# Patient Record
Sex: Male | Born: 1990
Health system: Southern US, Community
[De-identification: ages and names within clinical notes are randomized; demographics above are authoritative.]

## PROBLEM LIST (undated history)

## (undated) DIAGNOSIS — F329 Major depressive disorder, single episode, unspecified: Secondary | ICD-10-CM

## (undated) DIAGNOSIS — F419 Anxiety disorder, unspecified: Secondary | ICD-10-CM

## (undated) DIAGNOSIS — G43909 Migraine, unspecified, not intractable, without status migrainosus: Secondary | ICD-10-CM

## (undated) DIAGNOSIS — J039 Acute tonsillitis, unspecified: Secondary | ICD-10-CM

## (undated) DIAGNOSIS — F32A Depression, unspecified: Secondary | ICD-10-CM

## (undated) HISTORY — DX: Major depressive disorder, single episode, unspecified: F32.9

## (undated) HISTORY — DX: Depression, unspecified: F32.A

## (undated) HISTORY — DX: Migraine, unspecified, not intractable, without status migrainosus: G43.909

## (undated) HISTORY — PX: NO PAST SURGERIES: SHX2092

## (undated) HISTORY — DX: Anxiety disorder, unspecified: F41.9

## (undated) HISTORY — DX: Acute tonsillitis, unspecified: J03.90

---
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
## 2012-07-12 ENCOUNTER — Emergency Department (HOSPITAL_COMMUNITY): Payer: 59

## 2012-07-12 ENCOUNTER — Encounter (HOSPITAL_COMMUNITY): Payer: Self-pay | Admitting: *Deleted

## 2012-07-12 ENCOUNTER — Emergency Department (HOSPITAL_COMMUNITY)
Admission: EM | Admit: 2012-07-12 | Discharge: 2012-07-12 | Disposition: A | Discharge status: Home / Self Care | Payer: 59 | Attending: Emergency Medicine | Admitting: Emergency Medicine

## 2012-07-12 DIAGNOSIS — J36 Peritonsillar abscess: Secondary | ICD-10-CM

## 2012-07-12 LAB — CBC WITH DIFFERENTIAL/PLATELET
Basophils Absolute: 0.1 10*3/uL (ref 0.0–0.1)
Eosinophils Relative: 0 % (ref 0–5)
Lymphs Abs: 1.2 10*3/uL (ref 0.7–4.0)
MCV: 90.9 fL (ref 78.0–100.0)
Monocytes Absolute: 1.1 10*3/uL — ABNORMAL HIGH (ref 0.1–1.0)
Monocytes Relative: 23 % — ABNORMAL HIGH (ref 3–12)
Neutrophils Relative %: 47 % (ref 43–77)
Platelets: 182 10*3/uL (ref 150–400)
RBC: 4.85 MIL/uL (ref 4.22–5.81)
RDW: 12.2 % (ref 11.5–15.5)
WBC: 4.6 10*3/uL (ref 4.0–10.5)

## 2012-07-12 LAB — BASIC METABOLIC PANEL
CO2: 28 mEq/L (ref 19–32)
Calcium: 9.4 mg/dL (ref 8.4–10.5)
Creatinine, Ser: 0.83 mg/dL (ref 0.50–1.35)
GFR calc Af Amer: >90 mL/min (ref >90)
Sodium: 136 mEq/L (ref 135–145)

## 2012-07-12 MED ORDER — SODIUM CHLORIDE 0.9 % IV SOLN
1000.0000 mL | INTRAVENOUS | Status: DC
  Administered 2012-07-12: 1000 mL via INTRAVENOUS

## 2012-07-12 MED ORDER — IOHEXOL 300 MG/ML  SOLN
100.0000 mL | Freq: Once | INTRAMUSCULAR | Status: AC | PRN
  Administered 2012-07-12: 100 mL via INTRAVENOUS

## 2012-07-12 MED ORDER — ONDANSETRON HCL 4 MG/2ML IJ SOLN
4.0000 mg | Freq: Once | INTRAMUSCULAR | Status: AC
  Administered 2012-07-12: 4 mg via INTRAVENOUS
  Filled 2012-07-12: qty 2

## 2012-07-12 MED ORDER — MORPHINE SULFATE 4 MG/ML IJ SOLN
6.0000 mg | Freq: Once | INTRAMUSCULAR | Status: AC
  Administered 2012-07-12: 6 mg via INTRAVENOUS
  Filled 2012-07-12: qty 2

## 2012-07-12 MED ORDER — SODIUM CHLORIDE 0.9 % IV SOLN
1000.0000 mL | Freq: Once | INTRAVENOUS | Status: AC
  Administered 2012-07-12: 1000 mL via INTRAVENOUS

## 2012-07-12 MED ORDER — SODIUM CHLORIDE 0.9 % IV SOLN
3.0000 g | Freq: Once | INTRAVENOUS | Status: AC
  Administered 2012-07-12: 3 g via INTRAVENOUS
  Filled 2012-07-12: qty 3

## 2012-07-12 MED ORDER — DEXAMETHASONE SODIUM PHOSPHATE 10 MG/ML IJ SOLN
10.0000 mg | Freq: Once | INTRAMUSCULAR | Status: AC
  Administered 2012-07-12: 10 mg via INTRAVENOUS
  Filled 2012-07-12: qty 1

## 2012-07-12 NOTE — ED Provider Notes (Signed)
Medical screening examination/treatment/procedure(s) were conducted as a shared visit with non-physician practitioner(s) and myself.  I personally evaluated the patient during the encounter  Please see my other note. ENT follow up in office now. IV unasyn in ER along with symptomatic treatment  Lyanne Co, MD 07/12/12 1314

## 2012-07-12 NOTE — ED Notes (Signed)
Pt reports sore throat which started almost 2 weeks ago, went to [facility] clinic last Thursday, had a strep and mono test which was negative.  Was not getting any better, went to [facility] walk-in clinic last week and was given a "strong dose of abx and steroid injections."  Per family, they were given instructions to come to the ED if pt did not feel any better.  Pt has also taken day 4 day of PCN, stopped that and started augmentin today.  Throat appears red, pt reports pain when swallowing, denies any difficulty breathing at this time.

## 2012-07-12 NOTE — ED Provider Notes (Signed)
History     CSN: [id number]  Arrival date & time 07/12/12  1006   First MD Initiated Contact with Patient 07/12/12 1011      Chief Complaint  Patient presents with  . Sore Throat    (Consider location/radiation/quality/duration/timing/severity/associated sxs/prior treatment) HPI Comments: This is a 22 year old male, no pertinent past medical history, who presents emergency department with chief complaint of sore throat. Patient was seen approximately 2 weeks ago at [facility] and given a rapid strep and mono test which were both negative. He was given penicillin time. Patient was then reevaluated by [facility] positions, and was given an injection of antibiotic and steroid and treated with Augmentin. Patient was instructed to come to the emergency department if he was not any better. Patient states that he does not filling better today, so he came in for further evaluation. Still complaining of sore throat and low-grade fever. Denies any nausea, vomiting, or difficulty breathing. Patient states that his pain is worsened with swallowing. Pain is moderate in severity.  The history is provided by the patient. No language interpreter was used.    History reviewed. No pertinent past medical history.  History reviewed. No pertinent past surgical history.  No family history on file.  History  Substance Use Topics  . Smoking status: Never Smoker   . Smokeless tobacco: Never Used  . Alcohol Use: No      Review of Systems  All other systems reviewed and are negative.    Allergies  Review of patient's allergies indicates no known allergies.  Home Medications   Current Outpatient Rx  Name  Route  Sig  Dispense  Refill  . imipramine (TOFRANIL) 50 MG tablet   Oral   Take 150 mg by mouth 1 day or 1 dose.         . SUMAtriptan (IMITREX) 100 MG tablet   Oral   Take 1 tablet (100 mg total) by mouth 2 (two) times daily as needed.   10 tablet   6     BP 109/67  Pulse 106  Temp(Src)  99 F (37.2 C) (Oral)  Resp 20  SpO2 100%  Physical Exam  Nursing note and vitals reviewed. Constitutional: He is oriented to person, place, and time. He appears well-developed and well-nourished.  HENT:  Head: Normocephalic and atraumatic.  Nose: Nose normal.  Oropharynx is red and inflamed, right tonsil is swollen, with surrounding tissue also swollen, concern for peritonsillar abscess, exudates are present, uvula deviating laterally, airway is intact  Eyes: Conjunctivae and EOM are normal. Pupils are equal, round, and reactive to light. Right eye exhibits no discharge. Left eye exhibits no discharge. No scleral icterus.  Neck: Normal range of motion. Neck supple. No JVD present.  Cardiovascular: Normal rate, regular rhythm and normal heart sounds.  Exam reveals no gallop and no friction rub.   No murmur heard. Pulmonary/Chest: Effort normal and breath sounds normal. No respiratory distress. He has no wheezes. He has no rales. He exhibits no tenderness.  Abdominal: Soft. Bowel sounds are normal. He exhibits no distension and no mass. There is no tenderness. There is no rebound and no guarding.  Musculoskeletal: Normal range of motion. He exhibits no edema and no tenderness.  Neurological: He is alert and oriented to person, place, and time. He has normal reflexes.  CN 3-12 intact  Skin: Skin is warm and dry.  Psychiatric: He has a normal mood and affect. His behavior is normal. Judgment and thought content normal.  ED Course  Procedures (including critical care time)  Labs Reviewed  CBC WITH DIFFERENTIAL  BASIC METABOLIC PANEL   Results for orders placed during the hospital encounter of 07/12/12  CBC WITH DIFFERENTIAL      Result Value Range   WBC 4.6  4.0 - 10.5 K/uL   RBC 4.85  4.22 - 5.81 MIL/uL   Hemoglobin 15.3  13.0 - 17.0 g/dL   HCT 16.1  09.6 - 04.5 %   MCV 90.9  78.0 - 100.0 fL   MCH 31.5  26.0 - 34.0 pg   MCHC 34.7  30.0 - 36.0 g/dL   RDW 40.9  81.1 - 91.4 %    Platelets 182  150 - 400 K/uL   Neutrophils Relative 47  43 - 77 %   Lymphocytes Relative 27  12 - 46 %   Monocytes Relative 23 (*) 3 - 12 %   Eosinophils Relative 0  0 - 5 %   Basophils Relative 3 (*) 0 - 1 %   Neutro Abs 2.2  1.7 - 7.7 K/uL   Lymphs Abs 1.2  0.7 - 4.0 K/uL   Monocytes Absolute 1.1 (*) 0.1 - 1.0 K/uL   Eosinophils Absolute 0.0  0.0 - 0.7 K/uL   Basophils Absolute 0.1  0.0 - 0.1 K/uL   WBC Morphology VACUOLATED NEUTROPHILS    BASIC METABOLIC PANEL      Result Value Range   Sodium 136  135 - 145 mEq/L   Potassium 4.6  3.5 - 5.1 mEq/L   Chloride 97  96 - 112 mEq/L   CO2 28  19 - 32 mEq/L   Glucose, Bld 95  70 - 99 mg/dL   BUN 11  6 - 23 mg/dL   Creatinine, Ser 7.82  0.50 - 1.35 mg/dL   Calcium 9.4  8.4 - 95.6 mg/dL   GFR calc non Af Amer >90  >90 mL/min   GFR calc Af Amer >90  >90 mL/min   Ct Soft Tissue Neck W Contrast  07/12/2012  *RADIOLOGY REPORT*  Clinical Data: Sore throat.  Dysphagia.  Tonsillitis.  CT NECK WITH CONTRAST  Technique:  Multidetector CT imaging of the neck was performed with intravenous contrast.  Contrast: OMNIPAQUE IOHEXOL 300 MG/ML  SOLN  Comparison: None.  Findings: A 3.1 x 2.6 cm complex fluid collection identified in the right palatine tonsils, consistent with tonsillar abscess.  Left tonsillar tissues are enlarged and hyperenhancing with probable small 0.9 x 1.1 cm posterior fluid collection, also suggesting a tiny left-sided abscess.  Cervical lymphadenopathy is seen bilaterally, likely reactive.  Polypoid mucosal thickening is noted in the left maxillary sinus.  IMPRESSION: 3 cm complex fluid collection in the right palatine tonsil, consistent with tonsillar abscess.  There is probably a smaller approximately 1 cm abscess in the left tonsil.  Bulky cervical lymphadenopathy bilaterally, likely reactive.  I personally called the results of the study to the PA in the emergency department, Roxy Horseman, at 12:41 p.m. on 07/12/2012.    Original Report Authenticated By: Kennith Center, M.D.     I personally reviewed the imaging tests through PACS system  I reviewed available ER/hospitalization records through the EMR      1. Tonsillar abscess       MDM  22 year old male with sore throat, suspicious for peritonsillar abscess, will order labs, and moved to the acute side of the ED.  Care to be followed up by Dr. Patria Mane, who I have notified.  Roxy Horseman, PA-C 07/12/12 1312

## 2012-07-12 NOTE — ED Provider Notes (Signed)
1:00 PM Airway patent.  Nontoxic.  Tolerating secretions.  I discussed his case with the on-call ear nose and throat surgeon Dr. point Jenne Pane who agrees to see the patient in his office now.  The patient is stable for discharge from the emergency department with his parents taking him immediately to the ENT office.  No concern for airway compromise.  The encounter diagnosis was Tonsillar abscess.  Ct Soft Tissue Neck W Contrast  07/12/2012  *RADIOLOGY REPORT*  Clinical Data: Sore throat.  Dysphagia.  Tonsillitis.  CT NECK WITH CONTRAST  Technique:  Multidetector CT imaging of the neck was performed with intravenous contrast.  Contrast: OMNIPAQUE IOHEXOL 300 MG/ML  SOLN  Comparison: None.  Findings: A 3.1 x 2.6 cm complex fluid collection identified in the right palatine tonsils, consistent with tonsillar abscess.  Left tonsillar tissues are enlarged and hyperenhancing with probable small 0.9 x 1.1 cm posterior fluid collection, also suggesting a tiny left-sided abscess.  Cervical lymphadenopathy is seen bilaterally, likely reactive.  Polypoid mucosal thickening is noted in the left maxillary sinus.  IMPRESSION: 3 cm complex fluid collection in the right palatine tonsil, consistent with tonsillar abscess.  There is probably a smaller approximately 1 cm abscess in the left tonsil.  Bulky cervical lymphadenopathy bilaterally, likely reactive.  I personally called the results of the study to the PA in the emergency department, Roxy Horseman, at 12:41 p.m. on 07/12/2012.   Original Report Authenticated By: Kennith Center, M.D.    I personally reviewed the imaging tests through PACS system I reviewed available ER/hospitalization records through the EMR   Lyanne Co, MD 07/12/12 1300

## 2013-08-30 ENCOUNTER — Encounter (INDEPENDENT_AMBULATORY_CARE_PROVIDER_SITE_OTHER): Payer: Self-pay

## 2013-08-30 ENCOUNTER — Ambulatory Visit (INDEPENDENT_AMBULATORY_CARE_PROVIDER_SITE_OTHER): Payer: Managed Care, Other (non HMO) | Admitting: Neurology

## 2013-08-30 ENCOUNTER — Encounter: Payer: Self-pay | Admitting: Neurology

## 2013-08-30 VITALS — BP 97/58 | HR 65 | Ht 67.6 in | Wt 135.0 lb

## 2013-08-30 DIAGNOSIS — G43009 Migraine without aura, not intractable, without status migrainosus: Secondary | ICD-10-CM

## 2013-08-30 DIAGNOSIS — G8929 Other chronic pain: Secondary | ICD-10-CM

## 2013-08-30 DIAGNOSIS — R51 Headache: Secondary | ICD-10-CM

## 2013-08-30 DIAGNOSIS — R519 Headache, unspecified: Secondary | ICD-10-CM

## 2013-08-30 MED ORDER — RIZATRIPTAN BENZOATE 10 MG PO TABS: 10.0000 mg | ORAL_TABLET | Freq: Three times a day (TID) | ORAL | Status: DC | PRN

## 2013-08-30 NOTE — Patient Instructions (Signed)
Migraine Headache A migraine headache is an intense, throbbing pain on one or both sides of your head. A migraine can last for 30 minutes to several hours. CAUSES  The exact cause of a migraine headache is not always known. However, a migraine may be caused when nerves in the brain become irritated and release chemicals that cause inflammation. This causes pain. Certain things may also trigger migraines, such as:  Alcohol.  Smoking.  Stress.  Menstruation.  Aged cheeses.  Foods or drinks that contain nitrates, glutamate, aspartame, or tyramine.  Lack of sleep.  Chocolate.  Caffeine.  Hunger.  Physical exertion.  Fatigue.  Medicines used to treat chest pain (nitroglycerine), birth control pills, estrogen, and some blood pressure medicines. SIGNS AND SYMPTOMS  Pain on one or both sides of your head.  Pulsating or throbbing pain.  Severe pain that prevents daily activities.  Pain that is aggravated by any physical activity.  Nausea, vomiting, or both.  Dizziness.  Pain with exposure to bright lights, loud noises, or activity.  General sensitivity to bright lights, loud noises, or smells. Before you get a migraine, you may get warning signs that a migraine is coming (aura). An aura may include:  Seeing flashing lights.  Seeing bright spots, halos, or zig-zag lines.  Having tunnel vision or blurred vision.  Having feelings of numbness or tingling.  Having trouble talking.  Having muscle weakness. DIAGNOSIS  A migraine headache is often diagnosed based on:  Symptoms.  Physical exam.  A CT scan or MRI of your head. These imaging tests cannot diagnose migraines, but they can help rule out other causes of headaches. TREATMENT Medicines may be given for pain and nausea. Medicines can also be given to help prevent recurrent migraines.  HOME CARE INSTRUCTIONS  Only take over-the-counter or prescription medicines for pain or discomfort as directed by your  health care provider. The use of long-term narcotics is not recommended.  Lie down in a dark, quiet room when you have a migraine.  Keep a journal to find out what may trigger your migraine headaches. For example, write down:  What you eat and drink.  How much sleep you get.  Any change to your diet or medicines.  Limit alcohol consumption.  Quit smoking if you smoke.  Get 7 9 hours of sleep, or as recommended by your health care provider.  Limit stress.  Keep lights dim if bright lights bother you and make your migraines worse. SEEK IMMEDIATE MEDICAL CARE IF:   Your migraine becomes severe.  You have a fever.  You have a stiff neck.  You have vision loss.  You have muscular weakness or loss of muscle control.  You start losing your balance or have trouble walking.  You feel faint or pass out.  You have severe symptoms that are different from your first symptoms. MAKE SURE YOU:   Understand these instructions.  Will watch your condition.  Will get help right away if you are not doing well or get worse. Document Released: 04/07/2005 Document Revised: 01/26/2013 Document Reviewed: 12/13/2012 Exit[employer] Patient Information 2014 [employer].  

## 2013-08-30 NOTE — Progress Notes (Signed)
Reason for visit: Headache  Ethan Nelson is a 23 y.o. male  History of present illness:  Ethan Nelson is a 23 year old right-handed white male with a history of headaches dating back about 3 years. He indicates that the headaches will generally occur once a week, oftentimes on Monday afternoons at 4 PM. He indicates that when he wakes up that morning, he can oftentimes tell that he is going to have a headache. He indicates that the headaches are generally frontal in nature, associated with a pressure and a throbbing sensation. There is no nausea or vomiting, but he may have some decrease in appetite. He denies any visual complaints, or numbness or weakness with the headache. He does have some photophobia and phonophobia with the headache. Sleep will help the headache. He will take Excedrin Migraine with some benefit. In the past, Imitrex tablets were used, but this resulted in a significant panic attack. The patient denies any other particular activators for his headache. He is sent to this office for further evaluation.  Past Medical History  Diagnosis Date  . Migraine   . Depression   . Anxiety     Panic disorder  . Tonsillitis     History reviewed. No pertinent past surgical history.  Family History  Problem Relation Age of Onset  . Breast cancer Mother   . Hypertension Father   . Migraines Father   . Dementia Maternal Grandmother   . Stroke Paternal Grandmother     Social history:  reports that he has never smoked. He has never used smokeless tobacco. He reports that he uses illicit drugs (Marijuana). He reports that he does not drink alcohol.  Medications:  Current Outpatient Prescriptions on File Prior to Visit  Medication Sig Dispense Refill  . ALPRAZolam (XANAX) 0.25 MG tablet Take 0.25 mg by mouth at bedtime as needed for sleep.       No current facility-administered medications on file prior to visit.     No Known Allergies  ROS:  Out of a complete 14  system review of symptoms, the patient complains only of the following symptoms, and all other reviewed systems are negative.  Fatigue Feeling hot Headache Depression, anxiety, change in appetite, disinterest in activities  Blood pressure 97/58, pulse 65, height 5' 7.6" (1.717 m), weight 135 lb (61.236 kg).  Physical Exam  General: The patient is alert and cooperative at the time of the examination.  Eyes: Pupils are equal, round, and reactive to light. Discs are flat bilaterally.  Neck: The neck is supple, no carotid bruits are noted.  Respiratory: The respiratory examination is clear.  Cardiovascular: The cardiovascular examination reveals a regular rate and rhythm, no obvious murmurs or rubs are noted.  Neuromuscular: Range of movement of the cervical spine is full. No crepitus is noted in the temporomandibular joints.  Skin: Extremities are without significant edema.  Neurologic Exam  Mental status: The patient is alert and oriented x 3 at the time of the examination. The patient has apparent normal recent and remote memory, with an apparently normal attention span and concentration ability.  Cranial nerves: Facial symmetry is present. There is good sensation of the face to pinprick and soft touch bilaterally. The strength of the facial muscles and the muscles to head turning and shoulder shrug are normal bilaterally. Speech is well enunciated, no aphasia or dysarthria is noted. Extraocular movements are full. Visual fields are full. The tongue is midline, and the patient has symmetric elevation of the soft  palate. No obvious hearing deficits are noted.  Motor: The motor testing reveals 5 over 5 strength of all 4 extremities. Good symmetric motor tone is noted throughout.  Sensory: Sensory testing is intact to pinprick, soft touch, vibration sensation, and position sense on all 4 extremities. No evidence of extinction is noted.  Coordination: Cerebellar testing reveals good  finger-nose-finger and heel-to-shin bilaterally.  Gait and station: Gait is normal. Tandem gait is normal. Romberg is negative. No drift is seen.  Reflexes: Deep tendon reflexes are symmetric and normal bilaterally. Toes are downgoing bilaterally.   Assessment/Plan:  1. Migraine headache  2. Panic disorder  The patient is having headaches on a weekly basis. He will be given a trial on Maxalt tablets, and he can take this with his Excedrin Migraine. If the headaches continue or become more frequent, daily prophylactic medications can be used. The use of Frova may be considered in the future, as he indicates that he can predict when he is going to have a headache several hours before it begins. He will followup otherwise in 4 months.  Marlan Palau. Keith Deveron Shamoon MD 08/30/2013 5:41 PM  [facility] [address] [place], [place] [id number]  Phone [phone] Fax [phone]

## 2014-01-23 ENCOUNTER — Telehealth: Payer: Self-pay | Admitting: *Deleted

## 2014-01-23 NOTE — Telephone Encounter (Signed)
Patient's mother calling to schedule patient's appointment, scheduled patient for 01/26/14 at 3:30 pm with NP MM.

## 2014-01-26 ENCOUNTER — Ambulatory Visit (INDEPENDENT_AMBULATORY_CARE_PROVIDER_SITE_OTHER): Payer: Managed Care, Other (non HMO) | Admitting: Adult Health

## 2014-01-26 ENCOUNTER — Encounter (INDEPENDENT_AMBULATORY_CARE_PROVIDER_SITE_OTHER): Payer: Self-pay

## 2014-01-26 ENCOUNTER — Encounter: Payer: Self-pay | Admitting: Adult Health

## 2014-01-26 VITALS — BP 102/61 | HR 66 | Ht 67.6 in | Wt 132.6 lb

## 2014-01-26 DIAGNOSIS — G43009 Migraine without aura, not intractable, without status migrainosus: Secondary | ICD-10-CM

## 2014-01-26 MED ORDER — RIZATRIPTAN BENZOATE 10 MG PO TABS
10.0000 mg | ORAL_TABLET | Freq: Three times a day (TID) | ORAL | Status: DC | PRN
Start: 2014-01-26 — End: 2017-07-30

## 2014-01-26 NOTE — Patient Instructions (Signed)

## 2014-01-26 NOTE — Progress Notes (Signed)
I have read the note, and I agree with the clinical assessment and plan.  Ethan Nelson,Ethan Nelson   

## 2014-01-26 NOTE — Progress Notes (Signed)
PATIENT: Ethan Nelson DOB: March 06, 1991  REASON FOR VISIT: follow up HISTORY FROM: patient  HISTORY OF PRESENT ILLNESS: Ethan Nelson is a 23 year old male with a history of headaches. He returns today for follow-up. He is currently taking Maxalt and it works well for him. He states that during the summer he did not have many headaches. During the school year he has at least one headache a week.  Denies nausea or vomiting with the headaches but he does have a lack of appetite. + photophobia when he has the migraine. Denies any changes with his vision and denies any numbness or tingling. When he does not use the Maxalt he will use the Excedrin migraine.   HISTORY 08/30/13: 23 year old right-handed white male with a history of headaches dating back about 3 years. He indicates that the headaches will generally occur once a week, oftentimes on Monday afternoons at 4 PM. He indicates that when he wakes up that morning, he can oftentimes tell that he is going to have a headache. He indicates that the headaches are generally frontal in nature, associated with a pressure and a throbbing sensation. There is no nausea or vomiting, but he may have some decrease in appetite. He denies any visual complaints, or numbness or weakness with the headache. He does have some photophobia and phonophobia with the headache. Sleep will help the headache. He will take Excedrin Migraine with some benefit. In the past, Imitrex tablets were used, but this resulted in a significant panic attack. The patient denies any other particular activators for his headache. He is sent to this office for further evaluation.  REVIEW OF SYSTEMS: Full 14 system review of systems performed and notable only for:  Constitutional: Fatigue Eyes: N/A Ear/Nose/Throat: N/A  Skin: N/A  Cardiovascular: N/A  Respiratory: N/A  Gastrointestinal: N/A  Genitourinary: N/A Hematology/Lymphatic: N/A  Endocrine: N/A Musculoskeletal: Neck pain, neck  stiffness  Allergy/Immunology: N/A  Neurological: Headache Psychiatric: Agitation  Sleep: N/A   ALLERGIES: No Known Allergies  HOME MEDICATIONS: Outpatient Prescriptions Prior to Visit  Medication Sig Dispense Refill  . ALPRAZolam (XANAX) 0.25 MG tablet Take 0.25 mg by mouth at bedtime as needed for sleep.      . rizatriptan (MAXALT) 10 MG tablet Take 1 tablet (10 mg total) by mouth 3 (three) times daily as needed for migraine. May repeat in 2 hours if needed  12 tablet  3   No facility-administered medications prior to visit.    PAST MEDICAL HISTORY: Past Medical History  Diagnosis Date  . Migraine   . Depression   . Anxiety     Panic disorder  . Tonsillitis     PAST SURGICAL HISTORY: History reviewed. No pertinent past surgical history.  FAMILY HISTORY: Family History  Problem Relation Age of Onset  . Breast cancer Mother   . Hypertension Father   . Migraines Father   . Dementia Maternal Grandmother   . Stroke Paternal Grandmother     SOCIAL HISTORY: History   Social History  . Marital Status: Single    Spouse Name: N/A    Number of Children: 0  . Years of Education: college-2   Occupational History  . student    Social History Main Topics  . Smoking status: Never Smoker   . Smokeless tobacco: Never Used  . Alcohol Use: No  . Drug Use: Yes    Special: Marijuana  . Sexual Activity: Not on file   Other Topics Concern  . Not on  file   Social History Narrative   Patient lives at home with roommates.    Patient is single    Patient has no children.    Patient is a Archivistcollege student.    Patient is right handed.             PHYSICAL EXAM  Filed Vitals:   01/26/14 1544  BP: 102/61  Pulse: 66  Height: 5' 7.6" (1.717 m)  Weight: 132 lb 9.6 oz (60.147 kg)   Body mass index is 20.4 kg/(m^2).  Generalized: Well developed, in no acute distress   Neurological examination  Mentation: Alert oriented to time, place, history taking. Follows all  commands speech and language fluent Cranial nerve II-XII: Pupils were equal round reactive to light. Extraocular movements were full, visual field were full on confrontational test. Facial sensation and strength were normal.  Uvula tongue midline. Head turning and shoulder shrug  were normal and symmetric. Motor: The motor testing reveals 5 over 5 strength of all 4 extremities. Good symmetric motor tone is noted throughout.  Sensory: Sensory testing is intact to soft touch on all 4 extremities. No evidence of extinction is noted.  Coordination: Cerebellar testing reveals good finger-nose-finger and heel-to-shin bilaterally.  Gait and station: Gait is normal. Tandem gait is normal. Romberg is negative. No drift is seen.  Reflexes: Deep tendon reflexes are symmetric and normal bilaterally.    DIAGNOSTIC DATA (LABS, IMAGING, TESTING) - I reviewed patient records, labs, notes, testing and imaging myself where available.  Lab Results  Component Value Date   WBC 4.6 07/12/2012   HGB 15.3 07/12/2012   HCT 44.1 07/12/2012   MCV 90.9 07/12/2012   PLT 182 07/12/2012      Component Value Date/Time   NA 136 07/12/2012 1109   K 4.6 07/12/2012 1109   CL 97 07/12/2012 1109   CO2 28 07/12/2012 1109   GLUCOSE 95 07/12/2012 1109   BUN 11 07/12/2012 1109   CREATININE 0.83 07/12/2012 1109   CALCIUM 9.4 07/12/2012 1109   GFRNONAA >90 07/12/2012 1109   GFRAA >90 07/12/2012 1109   ASSESSMENT AND PLAN 23 y.o. year old male  has a past medical history of Migraine; Depression; Anxiety; and Tonsillitis. here with:  1. Headache  Patient feels that his headaches are manageable with the Maxalt. He does have 1 headache a week but at this time he is not interested in starting daily medication. He should continue to use the Maxalt, I will refill today. If his headache frequency increases he should let us know. He will follow-up in 6 months or sooner if needed.   Ethan PennyMegan Larry Alcock, MSN, NP-C 01/26/2014, 4:02 PM [facility] [address], [address] [place], [place] [address] [phone]  Note: This document was prepared with digital dictation and possible smart phrase technology. Any transcriptional errors that result from this process are unintentional.

## 2014-09-05 IMAGING — CT CT NECK W/ CM
1 series · 12 of 14 positions shown, 15 images · IV contrast (OMNIPAQUE 300)
Comparison: None.

CLINICAL DATA: Sore throat.  Dysphagia.  Tonsillitis.

CT NECK WITH CONTRAST
TECHNIQUE: Multidetector CT imaging of the neck was performed with
intravenous contrast.
Contrast: 100mL OMNIPAQUE IOHEXOL 300 MG/ML  SOLN

[Series 2: neck with st · axial · 0.44mm/px · z∈[-248,-48]mm · 12 of 120 slices shown, 15 images]
[im 10/120  soft-tissue]
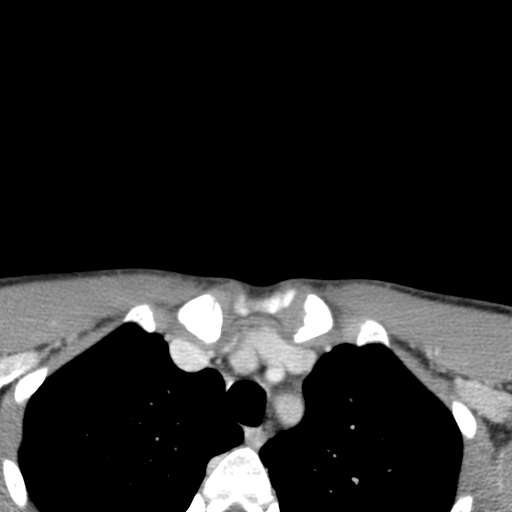
[im 10/120  bone]
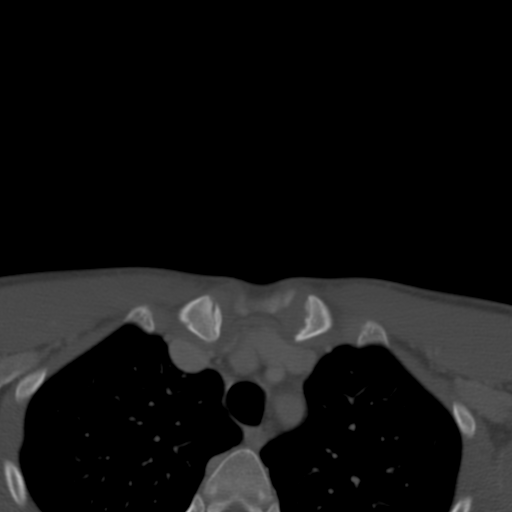
[im 19/120  bone]
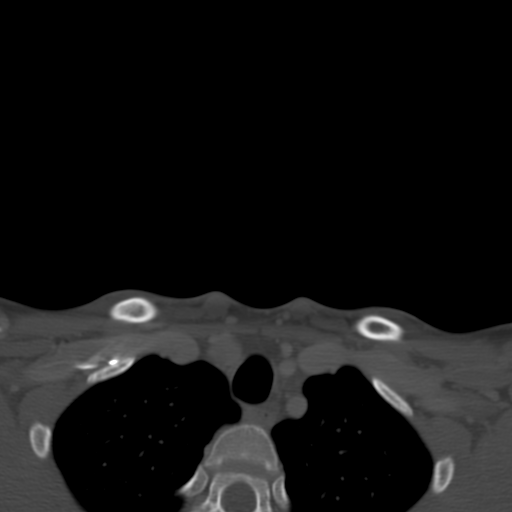
[im 28/120  bone]
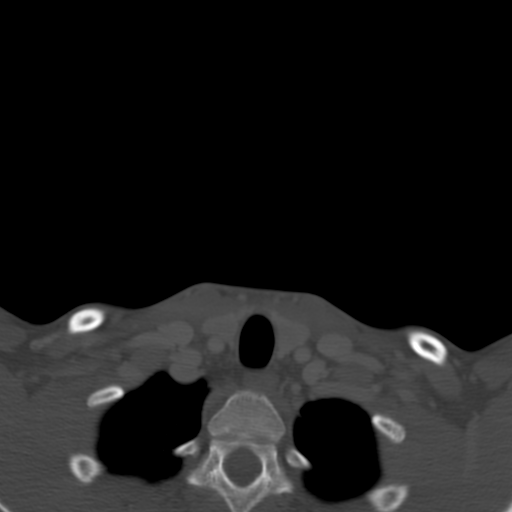
[im 37/120  bone]
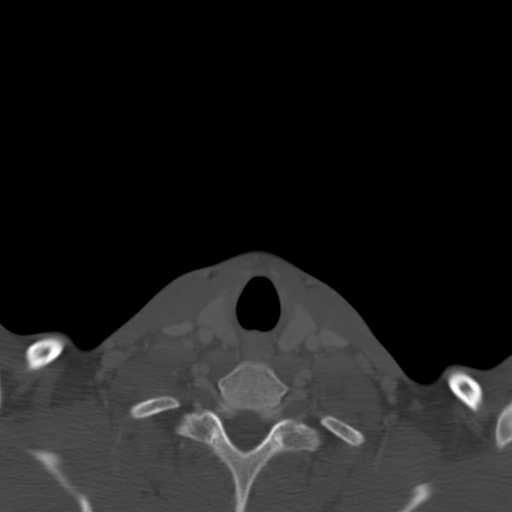
[im 46/120  soft-tissue]
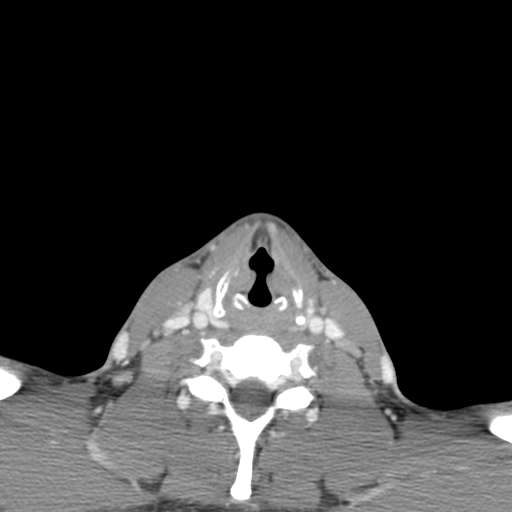
[im 46/120  bone]
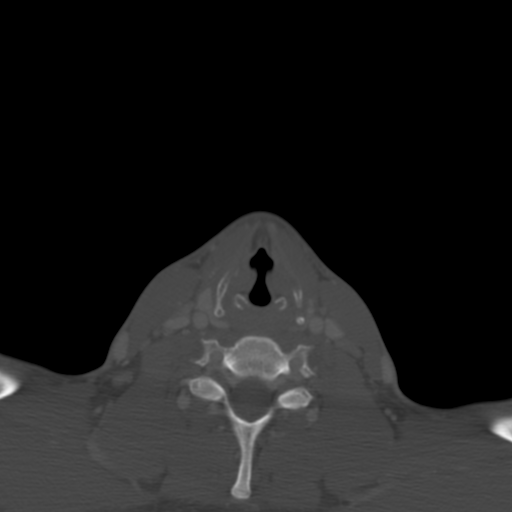
[im 55/120  bone]
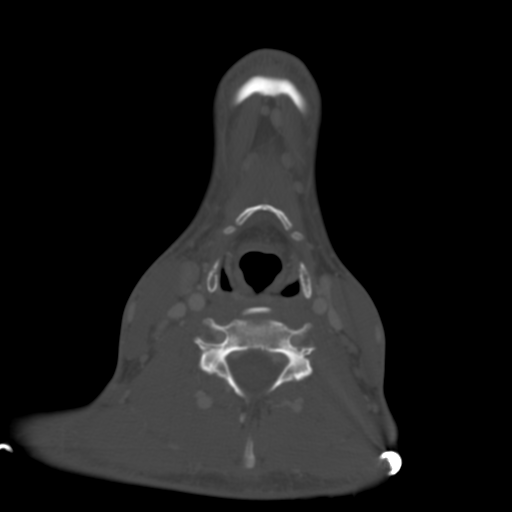
[im 65/120  bone]
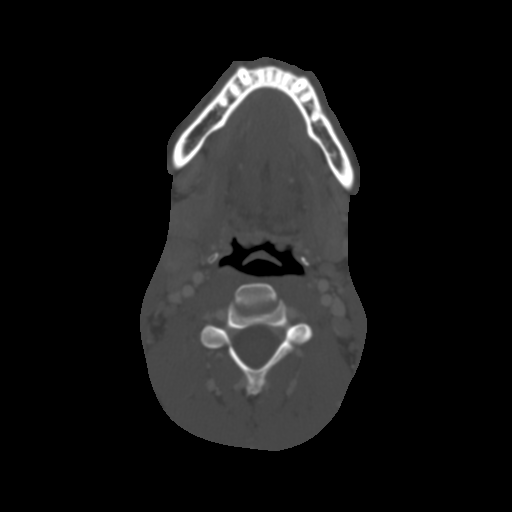
[im 74/120  bone]
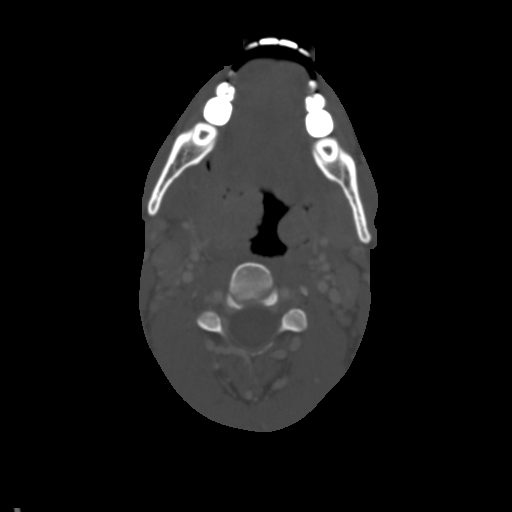
[im 83/120  soft-tissue]
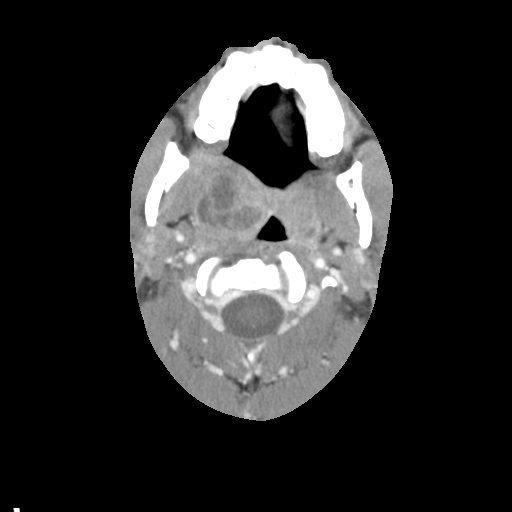
[im 83/120  bone]
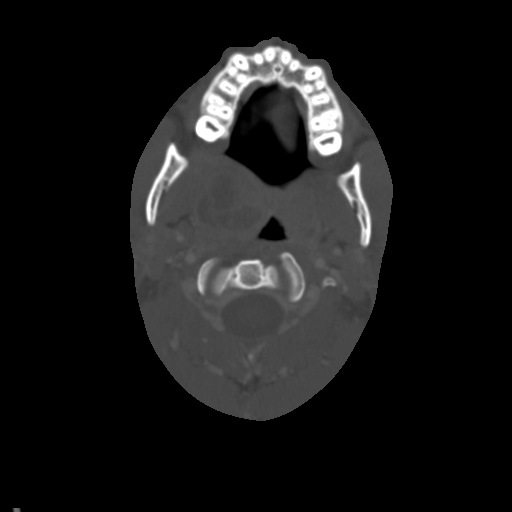
[im 92/120  bone]
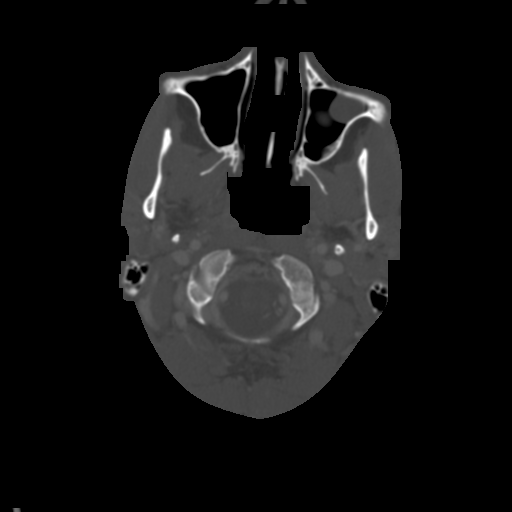
[im 101/120  bone]
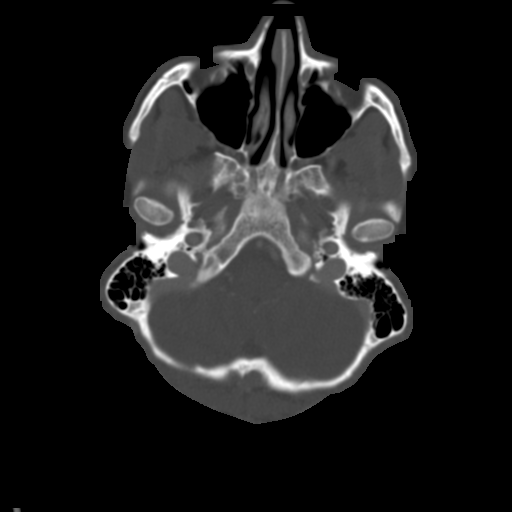
[im 110/120  bone]
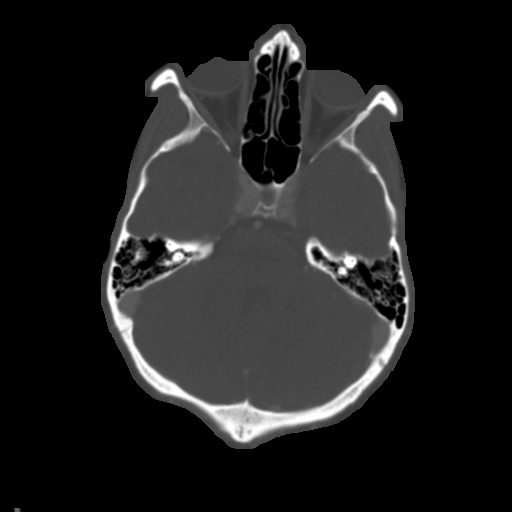

[12 of 14 positions shown; findings below may reference images not displayed]

FINDINGS: A 3.1 x 2.6 cm complex fluid collection identified in the
right palatine tonsils, consistent with tonsillar abscess.  Left
tonsillar tissues are enlarged and hyperenhancing with probable
small 0.9 x 1.1 cm posterior fluid collection, also suggesting a
tiny left-sided abscess.

Cervical lymphadenopathy is seen bilaterally, likely reactive.

Polypoid mucosal thickening is noted in the left maxillary sinus..
IMPRESSION: 3 cm complex fluid collection in the right palatine tonsil,
consistent with tonsillar abscess.  There is probably a smaller
approximately 1 cm abscess in the left tonsil.

Bulky cervical lymphadenopathy bilaterally, likely reactive.

I personally called the results of the study to the PA in the
emergency department, Sinaye Kata, at [DATE] p.m. on 07/12/2012.

## 2017-03-27 ENCOUNTER — Institutional Professional Consult (permissible substitution): Payer: BLUE CROSS/BLUE SHIELD | Admitting: Neurology

## 2017-04-16 ENCOUNTER — Ambulatory Visit: Payer: BLUE CROSS/BLUE SHIELD | Admitting: Neurology

## 2017-04-16 ENCOUNTER — Encounter: Payer: Self-pay | Admitting: Neurology

## 2017-04-16 VITALS — BP 124/70 | HR 72 | Ht 67.0 in | Wt 152.0 lb

## 2017-04-16 DIAGNOSIS — G43709 Chronic migraine without aura, not intractable, without status migrainosus: Secondary | ICD-10-CM | POA: Diagnosis not present

## 2017-04-16 MED ORDER — ALMOTRIPTAN MALATE 12.5 MG PO TABS: 12.5000 mg | ORAL_TABLET | ORAL | 11 refills | Status: DC | PRN

## 2017-04-16 MED ORDER — FREMANEZUMAB-VFRM 225 MG/1.5ML ~~LOC~~ SOSY: 225.0000 mg | PREFILLED_SYRINGE | SUBCUTANEOUS | 11 refills | Status: DC

## 2017-04-16 NOTE — Progress Notes (Signed)
[facility]    Provider:  Dr Lucia Gaskins Referring Provider: Lewis Moccasin, MD Primary Care Physician:  Lewis Moccasin, MD  CC:  Migraine  HPI:  Ethan Nelson is a 26 y.o. male here as a referral from Dr. Duanne Guess for migraines. PMHx Migraine, depression, anxiety. Father has cluster headaches. He started having migraines in HS. He has 8-10 migraine days a month.  He can function when he has them but it is very difficult, all are at least moderate and maybe a few days of severe in the bed and crying. Migraines are unilateral, more frontal in the forehead region, dull, pulsating quality, loud sounds bother him, he has light sensitivity, a cold wet washcloth helps. He has light and smell sensitivity. Can last 24 hours. Sleeping helps. He has nausea, no vomiting. Uses acute medication 10 days a month. Ongoing at this frequency for over 3 years. He has [facility] as well as Dr. Anne Hahn here at [facility] over 3 years ago. Not eating and poor sleep is a trigger. Migraines have woken him in the morning.  Medications tried: maxalt, Excedrin, sumatriptan (significant panic attacks), Topiramate, Imipramine  Reviewed notes, labs and imaging from outside physicians, which showed:  Reviewed neurology notes.  Patient was last seen by neurology in May 2015.  At that time he described headaches which started about 3 years prior.  He was having headaches once a week often times on Monday afternoons.  Headaches are generally frontal in nature, associated with pressure and a throbbing sensation.  There is no nausea or vomiting but he may have some decreased in appetite.  He will have some photophobia and phonophobia with the headache.  Sleep helps, Excedrin Migraine also helps, he was tried on Imitrex and Maxalt.  Imitrex caused significant panic attacks.  Cbc/bmp unremarkable  CT neck 2014: reviewed report  IMPRESSION: 3 cm complex fluid collection in the right  palatine tonsil, consistent with tonsillar abscess.  There is probably a smaller approximately 1 cm abscess in the left tonsil.  Bulky cervical lymphadenopathy bilaterally, likely reactive.  Review of Systems: Patient complains of symptoms per HPI as well as the following symptoms: headache, snoring, anxiety. Pertinent negatives and positives per HPI. All others negative.   Social History   Socioeconomic History  . Marital status: Single    Spouse name: Not on file  . Number of children: 0  . Years of education: college-2  . Highest education level: Not on file  Social Needs  . Financial resource strain: Not on file  . Food insecurity - worry: Not on file  . Food insecurity - inability: Not on file  . Transportation needs - medical: Not on file  . Transportation needs - non-medical: Not on file  Occupational History  . Occupation: Dentist: [employer]  Tobacco Use  . Smoking status: Never Smoker  . Smokeless tobacco: Never Used  Substance and Sexual Activity  . Alcohol use: No  . Drug use: Yes    Types: Marijuana  . Sexual activity: Not on file  Other Topics Concern  . Not on file  Social History Narrative   Patient lives at home with roommates.    Patient is single    Patient has no children.    Patient is a Archivist.    Patient is right handed.           Family History  Problem Relation Age of Onset  .  Breast cancer Mother   . Hypertension Father   . Migraines Father   . Dementia Maternal Grandmother   . Stroke Paternal Grandmother     Past Medical History:  Diagnosis Date  . Anxiety    Panic disorder  . Depression   . Migraine   . Tonsillitis     Past Surgical History:  Procedure Laterality Date  . NO PAST SURGERIES      Current Outpatient Medications  Medication Sig Dispense Refill  . aspirin-acetaminophen-caffeine (EXCEDRIN MIGRAINE) 250-250-65 MG tablet Take by mouth every 6 (six) hours as needed for headache.    .  rizatriptan (MAXALT) 10 MG tablet Take 1 tablet (10 mg total) by mouth 3 (three) times daily as needed for migraine. May repeat in 2 hours if needed 12 tablet 3  . almotriptan (AXERT) 12.5 MG tablet Take 1 tablet (12.5 mg total) by mouth as needed for migraine. may repeat in 2 hours if needed 10 tablet 11  . Fremanezumab-vfrm (AJOVY) 225 MG/1.5ML SOSY Inject 225 mg into the skin every 30 (thirty) days. 1 Syringe 11   No current facility-administered medications for this visit.     Allergies as of 04/16/2017  . (No Known Allergies)    Vitals: BP 124/70   Pulse 72   Ht 5\' 7"  (1.702 m)   Wt 152 lb (68.9 kg)   BMI 23.81 kg/m  Last Weight:  Wt Readings from Last 1 Encounters:  04/16/17 152 lb (68.9 kg)   Last Height:   Ht Readings from Last 1 Encounters:  04/16/17 5\' 7"  (1.702 m)   Physical exam: Exam: Gen: NAD, conversant, well nourised, well groomed                     CV: RRR, no MRG. No Carotid Bruits. No peripheral edema, warm, nontender Eyes: Conjunctivae clear without exudates or hemorrhage  Neuro: Detailed Neurologic Exam  Speech:    Speech is normal; fluent and spontaneous with normal comprehension.  Cognition:    The patient is oriented to person, place, and time;     recent and remote memory intact;     language fluent;     normal attention, concentration,     fund of knowledge Cranial Nerves:    The pupils are equal, round, and reactive to light. The fundi are normal and spontaneous venous pulsations are present. Visual fields are full to finger confrontation. Extraocular movements are intact. Trigeminal sensation is intact and the muscles of mastication are normal. The face is symmetric. The palate elevates in the midline. Hearing intact. Voice is normal. Shoulder shrug is normal. The tongue has normal motion without fasciculations.   Coordination:    Normal finger to nose and heel to shin. Normal rapid alternating movements.   Gait:    Heel-toe and tandem  gait are normal.   Motor Observation:    No asymmetry, no atrophy, and no involuntary movements noted. Tone:    Normal muscle tone.    Posture:    Posture is normal. normal erect    Strength:    Strength is V/V in the upper and lower limbs.      Sensation: intact to LT     Reflex Exam:  DTR's:    Deep tendon reflexes in the upper and lower extremities are normal bilaterally.   Toes:    The toes are downgoing bilaterally.   Clonus:    Clonus is absent.       Assessment/Plan:  26  year old with chronic migraines  Start Ajovy for preventative Almotriptan acutely MRI brain w/wo contrast due to positional quality if headaches to evaluate for intracranial etiology such as space-occupying mass, chiari, IIH or other  Discussed: To prevent or relieve headaches, try the following: Cool Compress. Lie down and place a cool compress on your head.  Avoid headache triggers. If certain foods or odors seem to have triggered your migraines in the past, avoid them. A headache diary might help you identify triggers.  Include physical activity in your daily routine. Try a daily walk or other moderate aerobic exercise.  Manage stress. Find healthy ways to cope with the stressors, such as delegating tasks on your to-do list.  Practice relaxation techniques. Try deep breathing, yoga, massage and visualization.  Eat regularly. Eating regularly scheduled meals and maintaining a healthy diet might help prevent headaches. Also, drink plenty of fluids.  Follow a regular sleep schedule. Sleep deprivation might contribute to headaches Consider biofeedback. With this mind-body technique, you learn to control certain bodily functions - such as muscle tension, heart rate and blood pressure - to prevent headaches or reduce headache pain.    Proceed to emergency room if you experience new or worsening symptoms or symptoms do not resolve, if you have new neurologic symptoms or if headache is severe, or for  any concerning symptom.   Provided education and documentation from American headache Society toolbox including articles on: chronic migraine medication overuse headache, chronic migraines, prevention of migraines, behavioral and other nonpharmacologic treatments for headache.  Cc: dr dewey   Naomie DeanAntonia Ahern, MD  [facility] [address] [place], [place] [address]  Phone [phone] Fax [phone]

## 2017-04-16 NOTE — Patient Instructions (Addendum)
Magnesium citrate 400mg  to 600mg  daily, riboflavin 400mg  daily, Coenzyme Q 10 100mg  three times daily  Keep Migraine calendar Consider joining Facebook group [facility].            Almotriptan: Please take one tablet at the onset of your headache. If it does not improve the symptoms please take one additional tablet in 2 hours. Do not take more then 2 tablets in 24hrs. Do not take use more then 2 to 3 times in a week. Ajovy: injected monthly MRI brain w/wo contrast  Migraine Headache A migraine headache is an intense, throbbing pain on one side or both sides of the head. Migraines may also cause other symptoms, such as nausea, vomiting, and sensitivity to light and noise. What are the causes? Doing or taking certain things may also trigger migraines, such as:  Alcohol.  Smoking.  Medicines, such as: ? Medicine used to treat chest pain (nitroglycerine). ? Birth control pills. ? Estrogen pills. ? Certain blood pressure medicines.  Aged cheeses, chocolate, or caffeine.  Foods or drinks that contain nitrates, glutamate, aspartame, or tyramine.  Physical activity.  Other things that may trigger a migraine include:  Menstruation.  Pregnancy.  Hunger.  Stress, lack of sleep, too much sleep, or fatigue.  Weather changes.  What increases the risk? The following factors may make you more likely to experience migraine headaches:  Age. Risk increases with age.  Family history of migraine headaches.  Being Caucasian.  Depression and anxiety.  Obesity.  Being a woman.  Having a hole in the heart (patent foramen ovale) or other heart problems.  What are the signs or symptoms? The main symptom of this condition is pulsating or throbbing pain. Pain may:  Happen in any area of the head, such as on one side or both sides.  Interfere with daily activities.  Get worse with physical activity.  Get worse with exposure to bright lights or loud  noises.  Other symptoms may include:  Nausea.  Vomiting.  Dizziness.  General sensitivity to bright lights, loud noises, or smells.  Before you get a migraine, you may get warning signs that a migraine is developing (aura). An aura may include:  Seeing flashing lights or having blind spots.  Seeing bright spots, halos, or zigzag lines.  Having tunnel vision or blurred vision.  Having numbness or a tingling feeling.  Having trouble talking.  Having muscle weakness.  How is this diagnosed? A migraine headache can be diagnosed based on:  Your symptoms.  A physical exam.  Tests, such as CT scan or MRI of the head. These imaging tests can help rule out other causes of headaches.  Taking fluid from the spine (lumbar puncture) and analyzing it (cerebrospinal fluid analysis, or CSF analysis).  How is this treated? A migraine headache is usually treated with medicines that:  Relieve pain.  Relieve nausea.  Prevent migraines from coming back.  Treatment may also include:  Acupuncture.  Lifestyle changes like avoiding foods that trigger migraines.  Follow these instructions at home: Medicines  Take over-the-counter and prescription medicines only as told by your health care provider.  Do not drive or use heavy machinery while taking prescription pain medicine.  To prevent or treat constipation while you are taking prescription pain medicine, your health care provider may recommend that you: ? Drink enough fluid to keep your urine clear or pale yellow. ? Take over-the-counter or prescription medicines. ? Eat foods that are high in fiber, such as  fresh fruits and vegetables, whole grains, and beans. ? Limit foods that are high in fat and processed sugars, such as fried and sweet foods. Lifestyle  Avoid alcohol use.  Do not use any products that contain nicotine or tobacco, such as cigarettes and e-cigarettes. If you need help quitting, ask your health care  provider.  Get at least 8 hours of sleep every night.  Limit your stress. General instructions   Keep a journal to find out what may trigger your migraine headaches. For example, write down: ? What you eat and drink. ? How much sleep you get. ? Any change to your diet or medicines.  If you have a migraine: ? Avoid things that make your symptoms worse, such as bright lights. ? It may help to lie down in a dark, quiet room. ? Do not drive or use heavy machinery. ? Ask your health care provider what activities are safe for you while you are experiencing symptoms.  Keep all follow-up visits as told by your health care provider. This is important. Contact a health care provider if:  You develop symptoms that are different or more severe than your usual migraine symptoms. Get help right away if:  Your migraine becomes severe.  You have a fever.  You have a stiff neck.  You have vision loss.  Your muscles feel weak or like you cannot control them.  You start to lose your balance often.  You develop trouble walking.  You faint. This information is not intended to replace advice given to you by your health care provider. Make sure you discuss any questions you have with your health care provider. Document Released: 04/07/2005 Document Revised: 10/26/2015 Document Reviewed: 09/24/2015 Elsevier Interactive Patient Education  2017 [employer]

## 2017-05-15 ENCOUNTER — Telehealth: Payer: Self-pay | Admitting: *Deleted

## 2017-05-15 NOTE — Telephone Encounter (Signed)
Almotriptan PA completed on Cover My Meds  KEY: [id number]  APPROVED TODAY Effective from 05/15/2017 through 04/20/2038.  [facility] [address] and LVM that Almotriptan has been approved for this patient through 2039. Left office number and asked for call back with any questions. Also noted that office closes at noon.

## 2017-07-29 ENCOUNTER — Telehealth: Payer: Self-pay | Admitting: Neurology

## 2017-07-29 NOTE — Telephone Encounter (Signed)
Pt requesting a call back to discuss going back on rizatriptan (MAXALT) 10 MG tablet stating that this medication works better than almotriptan (AXERT) 12.5 MG tablet. Pt also only has one tablet left for Almotriptan

## 2017-07-30 MED ORDER — RIZATRIPTAN BENZOATE 10 MG PO TABS: 10.0000 mg | ORAL_TABLET | Freq: Three times a day (TID) | ORAL | 3 refills | Status: DC | PRN

## 2017-07-30 NOTE — Telephone Encounter (Signed)
That's fine. thanks

## 2017-07-30 NOTE — Telephone Encounter (Signed)
I called pt, advised him that Dr. Lucia GaskinsAhern is agreeable to switching pt back to maxalt and the RX was [facility] to [facility]. Pt verbalized understanding and appreciation.

## 2017-08-17 ENCOUNTER — Ambulatory Visit: Payer: BLUE CROSS/BLUE SHIELD | Admitting: Neurology

## 2017-08-17 ENCOUNTER — Encounter: Payer: Self-pay | Admitting: Neurology

## 2017-08-17 VITALS — BP 120/71 | HR 75 | Ht 67.0 in | Wt 164.0 lb

## 2017-08-17 DIAGNOSIS — G43711 Chronic migraine without aura, intractable, with status migrainosus: Secondary | ICD-10-CM | POA: Diagnosis not present

## 2017-08-17 DIAGNOSIS — G4440 Drug-induced headache, not elsewhere classified, not intractable: Secondary | ICD-10-CM | POA: Diagnosis not present

## 2017-08-17 MED ORDER — METHYLPREDNISOLONE 4 MG PO TBPK
ORAL_TABLET | ORAL | 1 refills | Status: DC
Start: 2017-08-17 — End: 2018-01-06

## 2017-08-17 MED ORDER — ZOLMITRIPTAN 5 MG PO TBDP: 5.0000 mg | ORAL_TABLET | ORAL | 11 refills | Status: DC | PRN

## 2017-08-17 MED ORDER — NAPROXEN 500 MG PO TABS: ORAL_TABLET | ORAL | 11 refills | Status: DC

## 2017-08-17 MED ORDER — PROPRANOLOL HCL ER 60 MG PO CP24: 60.0000 mg | ORAL_CAPSULE | Freq: Every day | ORAL | 6 refills | Status: DC

## 2017-08-17 NOTE — Progress Notes (Signed)
[id number]    Provider:  Dr Lucia Gaskins Referring Provider: Lewis Moccasin, MD Primary Care Physician:  Lewis Moccasin, MD  CC:  Migraine  Interval history   Interval history : Ethan Nelson is helping with severity. 20 headache days a month, most are migraines. Ajovy has helped with the severity but not the frequency. He takes rizatriptan and excedrin. Maxalt works.  Patient's mother is with him and provides much information.  Patient continues to take Excedrin every day.  We had a very long discussion again about medication overuse headache, reviewed the literature, provided another migraine packet and multiple reading material.  Advised patient that his migraines will continue for as long as he overuses medication, had an extended visit concerning this again.  Discussed with his mother.  Discussed ways to come off his medication overuse, will start propranolol and a steroid taper as well to see if we can help.  HPI:  Ethan Nelson is a 27 y.o. male here as a referral from Dr. Duanne Guess for migraines. PMHx Migraine, depression, anxiety. Father has cluster headaches. He started having migraines in HS. He has 8-10 migraine days a month.  He can function when he has them but it is very difficult, all are at least moderate and maybe a few days of severe in the bed and crying. Migraines are unilateral, more frontal in the forehead region, dull, pulsating quality, loud sounds bother him, he has light sensitivity, a cold wet washcloth helps. He has light and smell sensitivity. Can last 24 hours. Sleeping helps. He has nausea, no vomiting. Uses acute medication 10 days a month. Ongoing at this frequency for over 3 years. [facility] as well as Dr. Anne Hahn here at [facility] over 3 years ago. Not eating and poor sleep is a trigger. Migraines have woken him in the morning.  Medications tried: maxalt, Excedrin, sumatriptan (significant panic attacks), Topiramate,  Imipramine  Reviewed notes, labs and imaging from outside physicians, which showed:  Reviewed neurology notes.  Patient was last seen by neurology in May 2015.  At that time he described headaches which started about 3 years prior.  He was having headaches once a week often times on Monday afternoons.  Headaches are generally frontal in nature, associated with pressure and a throbbing sensation.  There is no nausea or vomiting but he may have some decreased in appetite.  He will have some photophobia and phonophobia with the headache.  Sleep helps, Excedrin Migraine also helps, he was tried on Imitrex and Maxalt.  Imitrex caused significant panic attacks.  Cbc/bmp unremarkable  CT neck 2014: reviewed report  IMPRESSION: 3 cm complex fluid collection in the right palatine tonsil, consistent with tonsillar abscess.  There is probably a smaller approximately 1 cm abscess in the left tonsil.  Bulky cervical lymphadenopathy bilaterally, likely reactive.  Review of Systems: Patient complains of symptoms per HPI as well as the following symptoms: headache, snoring, anxiety. Pertinent negatives and positives per HPI. All others negative.   Social History   Socioeconomic History  . Marital status: Single    Spouse name: Not on file  . Number of children: 0  . Years of education: college- 4 yrs  . Highest education level: Not on file  Occupational History  . Occupation: Dentist: [employer]  Social Needs  . Financial resource strain: Not on file  . Food insecurity:    Worry: Not on file    Inability:  Not on file  . Transportation needs:    Medical: Not on file    Non-medical: Not on file  Tobacco Use  . Smoking status: Passive Smoke Exposure - Never Smoker  . Smokeless tobacco: Never Used  Substance and Sexual Activity  . Alcohol use: No  . Drug use: Not Currently    Types: Marijuana    Comment: none in past 6 months as of 08/17/17  . Sexual activity: Not on file   Lifestyle  . Physical activity:    Days per week: Not on file    Minutes per session: Not on file  . Stress: Not on file  Relationships  . Social connections:    Talks on phone: Not on file    Gets together: Not on file    Attends religious service: Not on file    Active member of club or organization: Not on file    Attends meetings of clubs or organizations: Not on file    Relationship status: Not on file  . Intimate partner violence:    Fear of current or ex partner: Not on file    Emotionally abused: Not on file    Physically abused: Not on file    Forced sexual activity: Not on file  Other Topics Concern  . Not on file  Social History Narrative   Patient lives at home with roommates.    Patient is single    Patient has no children.    Patient graduated college   Patient is right handed.    Caffeine: coffee 2 cups every few days     Family History  Problem Relation Age of Onset  . Breast cancer Mother   . Hypertension Father   . Migraines Father   . Dementia Maternal Grandmother   . Stroke Paternal Grandmother     Past Medical History:  Diagnosis Date  . Anxiety    Panic disorder  . Depression   . Migraine   . Tonsillitis     Past Surgical History:  Procedure Laterality Date  . NO PAST SURGERIES      Current Outpatient Medications  Medication Sig Dispense Refill  . aspirin-acetaminophen-caffeine (EXCEDRIN MIGRAINE) 250-250-65 MG tablet Take by mouth every 6 (six) hours as needed for headache.    . Fremanezumab-vfrm (AJOVY) 225 MG/1.5ML SOSY Inject 225 mg into the skin every 30 (thirty) days. 1 Syringe 11  . rizatriptan (MAXALT) 10 MG tablet Take 1 tablet (10 mg total) by mouth 3 (three) times daily as needed for migraine. May repeat in 2 hours if needed 12 tablet 3   No current facility-administered medications for this visit.     Allergies as of 08/17/2017  . (No Known Allergies)    Vitals: BP 120/71 (BP Location: Right Arm, Patient Position:  Sitting)   Pulse 75   Ht  (1.702 m)   Wt 164 lb (74.4 kg)   BMI 25.69 kg/m  Last Weight:  Wt Readings from Last 1 Encounters:  08/17/17 164 lb (74.4 kg)   Last Height:   Ht Readings from Last 1 Encounters:  08/17/17  (1.702 m)   Physical exam: Exam: Gen: NAD, conversant, well nourised, well groomed                     CV: RRR, no MRG. No Carotid Bruits. No peripheral edema, warm, nontender Eyes: Conjunctivae clear without exudates or hemorrhage  Neuro: Detailed Neurologic Exam  Speech:    Speech is  normal; fluent and spontaneous with normal comprehension.  Cognition:    The patient is oriented to person, place, and time;     recent and remote memory intact;     language fluent;     normal attention, concentration,     fund of knowledge Cranial Nerves:    The pupils are equal, round, and reactive to light. The fundi are normal and spontaneous venous pulsations are present. Visual fields are full to finger confrontation. Extraocular movements are intact. Trigeminal sensation is intact and the muscles of mastication are normal. The face is symmetric. The palate elevates in the midline. Hearing intact. Voice is normal. Shoulder shrug is normal. The tongue has normal motion without fasciculations.   Coordination:    Normal finger to nose and heel to shin. Normal rapid alternating movements.   Gait:    Heel-toe and tandem gait are normal.   Motor Observation:    No asymmetry, no atrophy, and no involuntary movements noted. Tone:    Normal muscle tone.    Posture:    Posture is normal. normal erect    Strength:    Strength is V/V in the upper and lower limbs.      Sensation: intact to LT     Reflex Exam:  DTR's:    Deep tendon reflexes in the upper and lower extremities are normal bilaterally.   Toes:    The toes are downgoing bilaterally.   Clonus:    Clonus is absent.       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  -Patient continues to overuse Excedrin despite conversations about medication overuse or rebound headache.  Had an extensive visit with him today discussing this.  Unfortunately if he does not stop overusing Excedrin there is nothing that we can do to help him as this is causing chronic daily headaches.  We will continue the atrophy.  We will also start propranolol and give him a steroid taper to see if we can help with his rebound.  He can take naproxen with the triptan but again warned him that he cannot use acute medications more than 10 days in a month which includes naproxen, triptan's, Excedrin, Goody powder, opioids any acute medication.  Continue Ajovy for preventative Maxalt acutely MRI brain w/wo contrast due to positional quality if headaches to evaluate for intracranial etiology such as space-occupying mass, chiari, IIH or other: he declines  Discussed: To prevent or relieve headaches, try the following: Cool Compress. Lie down and place a cool compress on your head.  Avoid headache triggers. If certain foods or odors seem to have triggered your migraines in the past, avoid them. A headache diary might help you identify triggers.  Include physical activity in your daily routine. Try a daily walk or other moderate aerobic exercise.  Manage stress. Find healthy ways to cope with the stressors, such as delegating tasks on your to-do list.  Practice relaxation techniques. Try deep breathing, yoga, massage and visualization.  Eat regularly. Eating regularly scheduled meals and maintaining a healthy diet might help prevent headaches. Also, drink plenty of fluids.  Follow a regular sleep schedule. Sleep deprivation might contribute to headaches Consider biofeedback. With this mind-body technique, you learn to control certain bodily functions - such as muscle tension, heart rate and blood pressure - to prevent headaches or reduce headache pain.     Proceed to emergency room if you experience new or worsening symptoms or  symptoms do not resolve, if you have new neurologic symptoms or if headache is severe, or for any concerning symptom.   Provided education and documentation from [employer] toolbox including articles on: chronic migraine medication overuse headache, chronic migraines, prevention of migraines, behavioral and other nonpharmacologic treatments for headache.  Cc: dr dewey   Naomie Dean, MD  [facility] [address] [place], [place] [address]  Phone [phone] Fax [phone]  A total of 40 minutes was spent face-to-face with this patient. Over half this time was spent on counseling patient on the migraine and medication overuse diagnosis and different diagnostic and therapeutic options, counseling and coordination of care, risks ans benefits of management, compliance, or risk factor reduction and education.

## 2017-08-17 NOTE — Patient Instructions (Signed)
Medrol dosepak while you titrate off of excedrin for 6 days Propranolol at bedtime for preventative Continue Ajovy At onset of headache Zolmitriptan: Please take one tablet at the onset of your headache. If it does not improve the symptoms please take one additional tablet in 2 hours. Do not take more then 2 tablets in 24hrs. Do not take use more then 2 to 3 times in a week. Take with Naproxen Stop Rizatriptan  Propranolol extended-release capsules What is this medicine? PROPRANOLOL (proe PRAN oh lole) is a beta-blocker. Beta-blockers reduce the workload on the heart and help it to beat more regularly. This medicine is used to treat high blood pressure, heart muscle disease, and prevent chest pain caused by angina. It is also used to prevent migraine headaches. You should not use this medicine to treat a migraine that has already started. This medicine may be used for other purposes; ask your health care provider or pharmacist if you have questions. COMMON BRAND NAME(S): Inderal LA, Inderal XL, InnoPran XL What should I tell my health care provider before I take this medicine? They need to know if you have any of these conditions: -circulation problems, or blood vessel disease -diabetes -history of heart attack or heart disease, vasospastic angina -kidney disease -liver disease -lung or breathing disease, like asthma or emphysema -pheochromocytoma -slow heart rate -thyroid disease -an unusual or allergic reaction to propranolol, other beta-blockers, medicines, foods, dyes, or preservatives -pregnant or trying to get pregnant -breast-feeding How should I use this medicine? Take this medicine by mouth with a glass of water. Follow the directions on the prescription label. Do not crush or chew. Take your doses at regular intervals. Do not take your medicine more often than directed. Do not stop taking except on the advice of your doctor or health care professional. Talk to your pediatrician  regarding the use of this medicine in children. Special care may be needed. Overdosage: If you think you have taken too much of this medicine contact a poison control center or emergency room at once. NOTE: This medicine is only for you. Do not share this medicine with others. What if I miss a dose? If you miss a dose, take it as soon as you can. If it is almost time for your next dose, take only that dose. Do not take double or extra doses. What may interact with this medicine? Do not take this medicine with any of the following medications: -feverfew -phenothiazines like chlorpromazine, mesoridazine, prochlorperazine, thioridazine This medicine may also interact with the following medications: -aluminum hydroxide gel -antipyrine -antiviral medicines for HIV or AIDS -barbiturates like phenobarbital -certain medicines for blood pressure, heart disease, irregular heart beat -cimetidine -ciprofloxacin -diazepam -fluconazole -haloperidol -isoniazid -medicines for cholesterol like cholestyramine or colestipol -medicines for mental depression -medicines for migraine headache like almotriptan, eletriptan, frovatriptan, naratriptan, rizatriptan, sumatriptan, zolmitriptan -NSAIDs, medicines for pain and inflammation, like ibuprofen or naproxen -phenytoin -rifampin -teniposide -theophylline -thyroid medicines -tolbutamide -warfarin -zileuton This list may not describe all possible interactions. Give your health care provider a list of all the medicines, herbs, non-prescription drugs, or dietary supplements you use. Also tell them if you smoke, drink alcohol, or use illegal drugs. Some items may interact with your medicine. What should I watch for while using this medicine? Visit your doctor or health care professional for regular check ups. Contact your doctor right away if your symptoms worsen. Check your blood pressure and pulse rate regularly. Ask your health care professional what your  blood pressure  and pulse rate should be, and when you should contact them. Do not stop taking this medicine suddenly. This could lead to serious heart-related effects. You may get drowsy or dizzy. Do not drive, use machinery, or do anything that needs mental alertness until you know how this drug affects you. Do not stand or sit up quickly, especially if you are an older patient. This reduces the risk of dizzy or fainting spells. Alcohol can make you more drowsy and dizzy. Avoid alcoholic drinks. This medicine can affect blood sugar levels. If you have diabetes, check with your doctor or health care professional before you change your diet or the dose of your diabetic medicine. Do not treat yourself for coughs, colds, or pain while you are taking this medicine without asking your doctor or health care professional for advice. Some ingredients may increase your blood pressure. What side effects may I notice from receiving this medicine? Side effects that you should report to your doctor or health care professional as soon as possible: -allergic reactions like skin rash, itching or hives, swelling of the face, lips, or tongue -breathing problems -changes in blood sugar -cold hands or feet -difficulty sleeping, nightmares -dry peeling skin -hallucinations -muscle cramps or weakness -slow heart rate -swelling of the legs and ankles -vomiting Side effects that usually do not require medical attention (report to your doctor or health care professional if they continue or are bothersome): -change in sex drive or performance -diarrhea -dry sore eyes -hair loss -nausea -weak or tired This list may not describe all possible side effects. Call your doctor for medical advice about side effects. You may report side effects to FDA at 1-800-FDA-1088. Where should I keep my medicine? Keep out of the reach of children. Store at room temperature between 15 and 30 degrees C (59 and 86 degrees F). Protect  from light, moisture and freezing. Keep container tightly closed. Throw away any unused medicine after the expiration date. NOTE: This sheet is a summary. It may not cover all possible information. If you have questions about this medicine, talk to your doctor, pharmacist, or health care provider.  2018 Elsevier/Gold Standard (2012-12-10 14:58:56)  Methylprednisolone tablets What is this medicine? METHYLPREDNISOLONE (meth ill pred NISS oh lone) is a corticosteroid. It is commonly used to treat inflammation of the skin, joints, lungs, and other organs. Common conditions treated include asthma, allergies, and arthritis. It is also used for other conditions, such as blood disorders and diseases of the adrenal glands. This medicine may be used for other purposes; ask your health care provider or pharmacist if you have questions. COMMON BRAND NAME(S): Medrol, Medrol Dosepak What should I tell my health care provider before I take this medicine? They need to know if you have any of these conditions: -Cushing's syndrome -eye disease, vision problems -diabetes -glaucoma -heart disease -high blood pressure -infection (especially a virus infection such as chickenpox, cold sores, or herpes) -liver disease -mental illness -myasthenia gravis -osteoporosis -recently received or scheduled to receive a vaccine -seizures -stomach or intestine problems -thyroid disease -an unusual or allergic reaction to lactose, methylprednisolone, other medicines, foods, dyes, or preservatives -pregnant or trying to get pregnant -breast-feeding How should I use this medicine? Take this medicine by mouth with a glass of water. Follow the directions on the prescription label. Take this medicine with food. If you are taking this medicine once a day, take it in the morning. Do not take it more often than directed. Do not suddenly stop taking your  medicine because you may develop a severe reaction. Your doctor will tell  you how much medicine to take. If your doctor wants you to stop the medicine, the dose may be slowly lowered over time to avoid any side effects. Talk to your pediatrician regarding the use of this medicine in children. Special care may be needed. Overdosage: If you think you have taken too much of this medicine contact a poison control center or emergency room at once. NOTE: This medicine is only for you. Do not share this medicine with others. What if I miss a dose? If you miss a dose, take it as soon as you can. If it is almost time for your next dose, talk to your doctor or health care professional. You may need to miss a dose or take an extra dose. Do not take double or extra doses without advice. What may interact with this medicine? Do not take this medicine with any of the following medications: -alefacept -echinacea -live virus vaccines -metyrapone -mifepristone This medicine may also interact with the following medications: -amphotericin B -aspirin and aspirin-like medicines -certain antibiotics like erythromycin, clarithromycin, troleandomycin -certain medicines for diabetes -certain medicines for fungal infections like ketoconazole -certain medicines for seizures like carbamazepine, phenobarbital, phenytoin -certain medicines that treat or prevent blood clots like warfarin -cholestyramine -cyclosporine -digoxin -diuretics -male hormones, like estrogens and birth control pills -isoniazid -NSAIDs, medicines for pain inflammation, like ibuprofen or naproxen -other medicines for myasthenia gravis -rifampin -vaccines This list may not describe all possible interactions. Give your health care provider a list of all the medicines, herbs, non-prescription drugs, or dietary supplements you use. Also tell them if you smoke, drink alcohol, or use illegal drugs. Some items may interact with your medicine. What should I watch for while using this medicine? Tell your doctor or  healthcare professional if your symptoms do not start to get better or if they get worse. Do not stop taking except on your doctor's advice. You may develop a severe reaction. Your doctor will tell you how much medicine to take. This medicine may increase your risk of getting an infection. Tell your doctor or health care professional if you are around anyone with measles or chickenpox, or if you develop sores or blisters that do not heal properly. This medicine may affect blood sugar levels. If you have diabetes, check with your doctor or health care professional before you change your diet or the dose of your diabetic medicine. Tell your doctor or health care professional right away if you have any change in your eyesight. Using this medicine for a long time may increase your risk of low bone mass. Talk to your doctor about bone health. What side effects may I notice from receiving this medicine? Side effects that you should report to your doctor or health care professional as soon as possible: -allergic reactions like skin rash, itching or hives, swelling of the face, lips, or tongue -bloody or tarry stools -changes in vision -hallucination, loss of contact with reality -muscle cramps -muscle pain -palpitations -signs and symptoms of high blood sugar such as dizziness; dry mouth; dry skin; fruity breath; nausea; stomach pain; increased hunger or thirst; increased urination -signs and symptoms of infection like fever or chills; cough; sore throat; pain or trouble passing urine -trouble passing urine or change in the amount of urine Side effects that usually do not require medical attention (report to your doctor or health care professional if they continue or are bothersome): -changes in emotions  or mood -constipation -diarrhea -excessive hair growth on the face or body -headache -nausea, vomiting -trouble sleeping -weight gain This list may not describe all possible side effects. Call  your doctor for medical advice about side effects. You may report side effects to FDA at 1-800-FDA-1088. Where should I keep my medicine? Keep out of the reach of children. Store at room temperature between 20 and 25 degrees C (68 and 77 degrees F). Throw away any unused medicine after the expiration date. NOTE: This sheet is a summary. It may not cover all possible information. If you have questions about this medicine, talk to your doctor, pharmacist, or health care provider.  2018 Elsevier/Gold Standard (2015-06-14 15:53:30)  Naproxen and naproxen sodium oral immediate-release tablets What is this medicine? NAPROXEN (na PROX en) is a non-steroidal anti-inflammatory drug (NSAID). It is used to reduce swelling and to treat pain. This medicine may be used for dental pain, headache, or painful monthly periods. It is also used for painful joint and muscular problems such as arthritis, tendinitis, bursitis, and gout. This medicine may be used for other purposes; ask your health care provider or pharmacist if you have questions. COMMON BRAND NAME(S): Aflaxen, Aleve, Aleve Arthritis, All Day Relief, Anaprox, Anaprox DS, Naprosyn, [facility] Naproxen Sodium What should I tell my health care provider before I take this medicine? They need to know if you have any of these conditions: -asthma -cigarette smoker -drink more than 3 alcohol containing drinks a day -heart disease or circulation problems such as heart failure or leg edema (fluid retention) -high blood pressure -kidney disease -liver disease -stomach bleeding or ulcers -an unusual or allergic reaction to naproxen, aspirin, other NSAIDs, other medicines, foods, dyes, or preservatives -pregnant or trying to get pregnant -breast-feeding How should I use this medicine? Take this medicine by mouth with a glass of water. Follow the directions on the prescription label. Take it with food if your stomach gets upset. Try to not lie down for at  least 10 minutes after you take it. Take your medicine at regular intervals. Do not take your medicine more often than directed. Long-term, continuous use may increase the risk of heart attack or stroke. A special MedGuide will be given to you by the pharmacist with each prescription and refill. Be sure to read this information carefully each time. Talk to your pediatrician regarding the use of this medicine in children. Special care may be needed. Overdosage: If you think you have taken too much of this medicine contact a poison control center or emergency room at once. NOTE: This medicine is only for you. Do not share this medicine with others. What if I miss a dose? If you miss a dose, take it as soon as you can. If it is almost time for your next dose, take only that dose. Do not take double or extra doses. What may interact with this medicine? -alcohol -aspirin -cidofovir -diuretics -lithium -methotrexate -other drugs for inflammation like ketorolac or prednisone -pemetrexed -probenecid -warfarin This list may not describe all possible interactions. Give your health care provider a list of all the medicines, herbs, non-prescription drugs, or dietary supplements you use. Also tell them if you smoke, drink alcohol, or use illegal drugs. Some items may interact with your medicine. What should I watch for while using this medicine? Tell your doctor or health care professional if your pain does not get better. Talk to your doctor before taking another medicine for pain. Do not treat yourself. This medicine does  not prevent heart attack or stroke. In fact, this medicine may increase the chance of a heart attack or stroke. The chance may increase with longer use of this medicine and in people who have heart disease. If you take aspirin to prevent heart attack or stroke, talk with your doctor or health care professional. Do not take other medicines that contain aspirin, ibuprofen, or naproxen with  this medicine. Side effects such as stomach upset, nausea, or ulcers may be more likely to occur. Many medicines available without a prescription should not be taken with this medicine. This medicine can cause ulcers and bleeding in the stomach and intestines at any time during treatment. Do not smoke cigarettes or drink alcohol. These increase irritation to your stomach and can make it more susceptible to damage from this medicine. Ulcers and bleeding can happen without warning symptoms and can cause death. You may get drowsy or dizzy. Do not drive, use machinery, or do anything that needs mental alertness until you know how this medicine affects you. Do not stand or sit up quickly, especially if you are an older patient. This reduces the risk of dizzy or fainting spells. This medicine can cause you to bleed more easily. Try to avoid damage to your teeth and gums when you brush or floss your teeth. What side effects may I notice from receiving this medicine? Side effects that you should report to your doctor or health care professional as soon as possible: -black or bloody stools, blood in the urine or vomit -blurred vision -chest pain -difficulty breathing or wheezing -nausea or vomiting -severe stomach pain -skin rash, skin redness, blistering or peeling skin, hives, or itching -slurred speech or weakness on one side of the body -swelling of eyelids, throat, lips -unexplained weight gain or swelling -unusually weak or tired -yellowing of eyes or skin Side effects that usually do not require medical attention (report to your doctor or health care professional if they continue or are bothersome): -constipation -headache -heartburn This list may not describe all possible side effects. Call your doctor for medical advice about side effects. You may report side effects to FDA at 1-800-FDA-1088. Where should I keep my medicine? Keep out of the reach of children. Store at room temperature  between 15 and 30 degrees C (59 and 86 degrees F). Keep container tightly closed. Throw away any unused medicine after the expiration date. NOTE: This sheet is a summary. It may not cover all possible information. If you have questions about this medicine, talk to your doctor, pharmacist, or health care provider.  2018 Elsevier/Gold Standard (2009-04-09 20:10:16)

## 2017-08-18 DIAGNOSIS — G4440 Drug-induced headache, not elsewhere classified, not intractable: Secondary | ICD-10-CM | POA: Insufficient documentation

## 2017-09-02 ENCOUNTER — Telehealth: Payer: Self-pay | Admitting: *Deleted

## 2017-09-02 NOTE — Telephone Encounter (Signed)
Spoke with pt. He does not recall any details about taking Topiramate. He tried things in high school but cannot recall them.

## 2017-09-03 NOTE — Telephone Encounter (Signed)
Spoke with patient again today. He could remember taking an antidepressant Imipramine and he took that greater than a month.    PA completed on Cover My Meds for Ajovy. KEY: [id number]. Anticipate a response within 3 business days.

## 2017-09-08 MED ORDER — ERENUMAB-AOOE 140 MG/ML ~~LOC~~ SOAJ: 140.0000 mg | SUBCUTANEOUS | 11 refills | Status: DC

## 2017-09-08 NOTE — Addendum Note (Signed)
Addended by: Bertram Savin on: 09/08/2017 12:29 PM   Modules accepted: Orders

## 2017-09-08 NOTE — Telephone Encounter (Signed)
Ajovy denied. Aimovig and Emgality preferred by [employer]. Discussed with Dr. Lucia Gaskins. Will offer pt to switch to Aimovig 140 mg.   Spoke with patient. Discussed insurance denial of Ajovy. Offered pt to switch to another once monthly migraine prevention injectable called Aimovig. He verbalized understanding and agreement. Discussed that he can start the Aimovig as soon as his next dose of Ajovy was due. He last took Ajovy at the end of April. Discussed that this medication is an auto-injector with a button to press at the top of the pen. It automatically inserts the needle and the medication. It will click twice. Instructed pt to wash hands and cleanse site with alcohol swab. Used pen should be inserted in the sharps container. Patient is aware that the medication will come with detailed instructions on how to administer. He was encouraged to contact us or his pharmacy or Aimovig for any questions that may arise. He was also directed to [email or web address] for the copay card. He verbalized understanding and appreciation.

## 2017-09-10 ENCOUNTER — Telehealth: Payer: Self-pay

## 2017-09-10 NOTE — Telephone Encounter (Signed)
PA for Aimovig /mL has been approved through insurance, Effective from 09/10/2017 through 12/08/2017

## 2017-11-30 ENCOUNTER — Telehealth: Payer: Self-pay | Admitting: Neurology

## 2017-11-30 NOTE — Telephone Encounter (Signed)
PA submitted through cover my meds for the pt for renewal PA on Aimovig.  ZOX:[id number] Can take up to 3 days before hearing back.

## 2017-12-02 NOTE — Telephone Encounter (Signed)
Received determination from [employer] that Aimovig 140 mg has been approved from 11/30/17 through 11/29/2018.  Faxed approval letter to [employer]. Received a receipt of confirmation.

## 2017-12-17 ENCOUNTER — Ambulatory Visit: Payer: BLUE CROSS/BLUE SHIELD | Admitting: Neurology

## 2018-01-06 ENCOUNTER — Encounter: Payer: Self-pay | Admitting: Neurology

## 2018-01-06 ENCOUNTER — Ambulatory Visit: Payer: BLUE CROSS/BLUE SHIELD | Admitting: Neurology

## 2018-01-06 VITALS — BP 103/61 | HR 65 | Ht 67.0 in | Wt 161.0 lb

## 2018-01-06 DIAGNOSIS — G43709 Chronic migraine without aura, not intractable, without status migrainosus: Secondary | ICD-10-CM

## 2018-01-06 MED ORDER — PROPRANOLOL HCL ER 60 MG PO CP24: 60.0000 mg | ORAL_CAPSULE | Freq: Every day | ORAL | 11 refills | Status: DC

## 2018-01-06 MED ORDER — ERENUMAB-AOOE 140 MG/ML ~~LOC~~ SOAJ: 140.0000 mg | SUBCUTANEOUS | 11 refills | Status: DC

## 2018-01-06 MED ORDER — ZOLMITRIPTAN 5 MG PO TBDP: 5.0000 mg | ORAL_TABLET | ORAL | 11 refills | Status: DC | PRN

## 2018-01-06 MED ORDER — NAPROXEN 500 MG PO TABS: ORAL_TABLET | ORAL | 11 refills | Status: DC

## 2018-01-06 NOTE — Progress Notes (Signed)
[facility]    Provider:  Dr Lucia Gaskins Referring Provider: Lewis Moccasin, MD Primary Care Physician:  Lewis Moccasin, MD  CC:  Migraine  Interval history 01/06/2018: Patient here for follow up of headaches and migraines, medication overuse of Excedrin on Aimovig. He has improved he is getting 1x a week.  He has stopped the excedrin. Down from 20 migraines a month. 4-6 migraine days. Zomig and naproxen help. He is happy, will continue current treatment. Refill meds.  Interval history : Arnetha Massy is helping with severity. 20 headache days a month, most are migraines. Ajovy has helped with the severity but not the frequency. He takes rizatriptan and excedrin. Maxalt works.  Patient's mother is with him and provides much information.  Patient continues to take Excedrin every day.  We had a very long discussion again about medication overuse headache, reviewed the literature, provided another migraine packet and multiple reading material.  Advised patient that his migraines will continue for as long as he overuses medication, had an extended visit concerning this again.  Discussed with his mother.  Discussed ways to come off his medication overuse, will start propranolol and a steroid taper as well to see if we can help.  HPI:  Mills Mitton is a 27 y.o. male here as a referral from Dr. Duanne Guess for migraines. PMHx Migraine, depression, anxiety. Father has cluster headaches. He started having migraines in HS. He has 8-10 migraine days a month.  He can function when he has them but it is very difficult, all are at least moderate and maybe a few days of severe in the bed and crying. Migraines are unilateral, more frontal in the forehead region, dull, pulsating quality, loud sounds bother him, he has light sensitivity, a cold wet washcloth helps. He has light and smell sensitivity. Can last 24 hours. Sleeping helps. He has nausea, no vomiting. Uses acute medication 10 days a month. Ongoing  at this frequency for over 3 years. He has been [facility] as well as Dr. Anne Hahn here at [facility] over 3 years ago. Not eating and poor sleep is a trigger. Migraines have woken him in the morning.  Medications tried: maxalt, Excedrin, sumatriptan (significant panic attacks), Topiramate, Imipramine, zomig(working)  Reviewed notes, labs and imaging from outside physicians, which showed:  Reviewed neurology notes.  Patient was last seen by neurology in May 2015.  At that time he described headaches which started about 3 years prior.  He was having headaches once a week often times on Monday afternoons.  Headaches are generally frontal in nature, associated with pressure and a throbbing sensation.  There is no nausea or vomiting but he may have some decreased in appetite.  He will have some photophobia and phonophobia with the headache.  Sleep helps, Excedrin Migraine also helps, he was tried on Imitrex and Maxalt.  Imitrex caused significant panic attacks.  Cbc/bmp unremarkable  CT neck 2014: reviewed report  IMPRESSION: 3 cm complex fluid collection in the right palatine tonsil, consistent with tonsillar abscess.  There is probably a smaller approximately 1 cm abscess in the left tonsil.  Bulky cervical lymphadenopathy bilaterally, likely reactive.  Review of Systems: Patient complains of symptoms per HPI as well as the following symptoms: headache, snoring, anxiety. Pertinent negatives and positives per HPI. All others negative.   Social History   Socioeconomic History  . Marital status: Single    Spouse name: Not on file  . Number of children: 0  .  Years of education: college- 4 yrs  . Highest education level: Not on file  Occupational History  . Occupation: Dentiststudent    Employer: [employer]  Social Needs  . Financial resource strain: Not on file  . Food insecurity:    Worry: Not on file    Inability: Not on file  . Transportation needs:    Medical:  Not on file    Non-medical: Not on file  Tobacco Use  . Smoking status: Passive Smoke Exposure - Never Smoker  . Smokeless tobacco: Never Used  Substance and Sexual Activity  . Alcohol use: No  . Drug use: Not Currently    Types: Marijuana    Comment: none in past 1 year as of 01/06/18  . Sexual activity: Not on file  Lifestyle  . Physical activity:    Days per week: Not on file    Minutes per session: Not on file  . Stress: Not on file  Relationships  . Social connections:    Talks on phone: Not on file    Gets together: Not on file    Attends religious service: Not on file    Active member of club or organization: Not on file    Attends meetings of clubs or organizations: Not on file    Relationship status: Not on file  . Intimate partner violence:    Fear of current or ex partner: Not on file    Emotionally abused: Not on file    Physically abused: Not on file    Forced sexual activity: Not on file  Other Topics Concern  . Not on file  Social History Narrative   Patient lives at home with roommates.    Patient is single    Patient has no children.    Patient graduated college   Patient is right handed.    Caffeine: coffee 2 cups every few days     Family History  Problem Relation Age of Onset  . Breast cancer Mother   . Hypertension Father   . Migraines Father   . Dementia Maternal Grandmother   . Stroke Paternal Grandmother     Past Medical History:  Diagnosis Date  . Anxiety    Panic disorder  . Depression   . Migraine   . Tonsillitis     Past Surgical History:  Procedure Laterality Date  . NO PAST SURGERIES      Current Outpatient Medications  Medication Sig Dispense Refill  . Erenumab-aooe (AIMOVIG) 140 MG/ML SOAJ Inject 140 mg into the skin every 30 (thirty) days. 1 pen 11  . naproxen (NAPROSYN) 500 MG tablet Take with Zolmitriptan 10 tablet 11  . OVER THE COUNTER MEDICATION Take 2 each by mouth daily. Multivitamin Gummy    . propranolol ER  (INDERAL LA) 60 MG 24 hr capsule Take 1 capsule (60 mg total) by mouth at bedtime. 30 capsule 6  . zolmitriptan (ZOMIG ZMT) 5 MG disintegrating tablet Take 1 tablet (5 mg total) by mouth as needed for migraine. May repeat once in 2 hours 10 tablet 11   No current facility-administered medications for this visit.     Allergies as of 01/06/2018  . (No Known Allergies)    Vitals: BP 103/61 (BP Location: Right Arm, Patient Position: Sitting)   Pulse 65   Ht 5\' 7"  (1.702 m)   Wt 161 lb (73 kg)   BMI 25.22 kg/m  Last Weight:  Wt Readings from Last 1 Encounters:  01/06/18 161 lb (73  kg)   Last Height:   Ht Readings from Last 1 Encounters:  01/06/18 5\' 7"  (1.702 m)   Physical exam: Exam: Gen: NAD, conversant, well nourised, well groomed                     CV: RRR, no MRG. No Carotid Bruits. No peripheral edema, warm, nontender Eyes: Conjunctivae clear without exudates or hemorrhage  Neuro: Detailed Neurologic Exam  Speech:    Speech is normal; fluent and spontaneous with normal comprehension.  Cognition:    The patient is oriented to person, place, and time;     recent and remote memory intact;     language fluent;     normal attention, concentration,     fund of knowledge Cranial Nerves:    The pupils are equal, round, and reactive to light. The fundi are normal and spontaneous venous pulsations are present. Visual fields are full to finger confrontation. Extraocular movements are intact. Trigeminal sensation is intact and the muscles of mastication are normal. The face is symmetric. The palate elevates in the midline. Hearing intact. Voice is normal. Shoulder shrug is normal. The tongue has normal motion without fasciculations.   Coordination:    Normal finger to nose and heel to shin. Normal rapid alternating movements.   Gait:    Heel-toe and tandem gait are normal.   Motor Observation:    No asymmetry, no atrophy, and no involuntary movements noted. Tone:    Normal  muscle tone.    Posture:    Posture is normal. normal erect    Strength:    Strength is V/V in the upper and lower limbs.      Sensation: intact to LT     Reflex Exam:  DTR's:    Deep tendon reflexes in the upper and lower extremities are normal bilaterally.   Toes:    The toes are downgoing bilaterally.   Clonus:    Clonus is absent.       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  - DOing exceptionally well on Aimovig, he is happy will continue f/u next year refill meds -Patient stopped overusing excedrin  Continue Aimovig for preventative Maxalt acutely MRI brain w/wo contrast due to positional quality if headaches to evaluate for intracranial etiology such as space-occupying mass, chiari, IIH or other: he declines  Discussed: To prevent or relieve headaches, try the following: Cool Compress. Lie down and place a cool compress on your head.  Avoid headache triggers. If certain foods or odors seem to have triggered your migraines in the past, avoid them. A headache diary might help you identify triggers.  Include physical activity in your daily routine. Try a daily walk or other moderate aerobic exercise.  Manage stress. Find healthy ways to cope with the stressors, such as delegating tasks on your to-do list.  Practice relaxation techniques. Try deep breathing, yoga, massage and visualization.  Eat regularly. Eating regularly scheduled meals and maintaining a healthy diet might help prevent headaches. Also, drink plenty of fluids.  Follow a regular sleep schedule. Sleep deprivation might contribute to headaches Consider biofeedback. With this mind-body technique, you learn to control certain bodily functions - such as muscle tension, heart rate and blood pressure - to prevent headaches or reduce headache pain.    Proceed to emergency room if you experience new or worsening symptoms or symptoms do not  resolve, if you have new neurologic symptoms  or if headache is severe, or for any concerning symptom.   Provided education and documentation from American headache Society toolbox including articles on: chronic migraine medication overuse headache, chronic migraines, prevention of migraines, behavioral and other nonpharmacologic treatments for headache.  Cc: dr dewey   Naomie Dean, MD  [facility] [address] [place], [place] [address]  Phone [phone] Fax [phone]  A total of 15 minutes was spent face-to-face with this patient. Over half this time was spent on counseling patient on the migraine and medication overuse diagnosis and different diagnostic and therapeutic options, counseling and coordination of care, risks ans benefits of management, compliance, or risk factor reduction and education.

## 2018-11-23 ENCOUNTER — Telehealth: Payer: Self-pay | Admitting: *Deleted

## 2018-11-23 NOTE — Telephone Encounter (Signed)
Continuation PA for Aimovig completed on CMM. Key: [id number]. Awaiting BCBS determination. Pt seen within last year, doing well on Aimovig per note.

## 2018-11-25 NOTE — Telephone Encounter (Signed)
Received approval from [facility]. Aimovig approved 11/23/2018 through 11/22/2019. I faxed a copy to pt's pharmacy. Received a receipt of confirmation.

## 2019-01-10 ENCOUNTER — Telehealth: Payer: Self-pay | Admitting: Neurology

## 2019-01-10 NOTE — Telephone Encounter (Signed)
Can we reschedule patient's upcoming appointment to Amy please? It is a one year followup thanks.

## 2019-01-11 NOTE — Telephone Encounter (Signed)
I called patient and LVM requesting he call back to reschedule. If patient calls back he can be scheduled with Amy at his earliest convenience.

## 2019-01-12 ENCOUNTER — Ambulatory Visit: Payer: BLUE CROSS/BLUE SHIELD | Admitting: Neurology

## 2019-01-17 ENCOUNTER — Other Ambulatory Visit: Payer: Self-pay | Admitting: Neurology

## 2019-03-14 ENCOUNTER — Other Ambulatory Visit: Payer: Self-pay | Admitting: Neurology

## 2019-03-28 ENCOUNTER — Telehealth: Payer: Self-pay | Admitting: Neurology

## 2019-03-28 MED ORDER — ZOLMITRIPTAN 5 MG PO TBDP: 5.0000 mg | ORAL_TABLET | ORAL | 0 refills | Status: DC | PRN

## 2019-03-28 MED ORDER — NAPROXEN 500 MG PO TABS: ORAL_TABLET | ORAL | 0 refills | Status: AC

## 2019-03-28 NOTE — Addendum Note (Signed)
Addended by: Gildardo Griffes on: 03/28/2019 10:11 AM   Modules accepted: Orders

## 2019-03-28 NOTE — Telephone Encounter (Signed)
Pt has scheduled his annual f/u and is on wait list.  Pt is asking if he can get a refill on zolmitriptan (ZOMIG ZMT) 5 MG disintegrating tablet &  naproxen (NAPROSYN) 500 MG tablet Kristopher Oppen[facility] [place] [address] until his appointment

## 2019-03-28 NOTE — Telephone Encounter (Signed)
Refill of each x 1 sent to [facility].

## 2019-05-18 ENCOUNTER — Other Ambulatory Visit: Payer: Self-pay | Admitting: Neurology

## 2019-05-23 ENCOUNTER — Other Ambulatory Visit: Payer: Self-pay

## 2019-05-23 ENCOUNTER — Encounter: Payer: Self-pay | Admitting: Family Medicine

## 2019-05-23 ENCOUNTER — Ambulatory Visit: Payer: BLUE CROSS/BLUE SHIELD | Admitting: Family Medicine

## 2019-05-23 VITALS — BP 117/72 | HR 68 | Temp 98.3°F | Ht 67.0 in | Wt 155.4 lb

## 2019-05-23 DIAGNOSIS — G43709 Chronic migraine without aura, not intractable, without status migrainosus: Secondary | ICD-10-CM

## 2019-05-23 MED ORDER — AIMOVIG 140 MG/ML ~~LOC~~ SOAJ: 140.0000 mg | SUBCUTANEOUS | 11 refills | Status: AC

## 2019-05-23 MED ORDER — ZOLMITRIPTAN 5 MG PO TBDP: 5.0000 mg | ORAL_TABLET | ORAL | 11 refills | Status: AC | PRN

## 2019-05-23 MED ORDER — PROPRANOLOL HCL ER 60 MG PO CP24: 60.0000 mg | ORAL_CAPSULE | Freq: Every day | ORAL | 11 refills | Status: AC

## 2019-05-23 NOTE — Progress Notes (Addendum)
PATIENT: Ethan Nelson DOB: [birth year]  REASON FOR VISIT: follow up HISTORY FROM: patient  Chief Complaint  Patient presents with  . Follow-up    Alone. Rm 1. No new concerns at this time.   . Medication Refill    Aimovig, Propranolol and Zolmitriptan     HISTORY OF PRESENT ILLNESS: Today 05/23/19 Ethan Nelson is a 29 y.o. male here today for follow up for migraines. He continues Amovig monthly, propranolol 60mg  daily and zolmitriptan as needed. He feels that he is doing well. He reports about 8-10 headache days per month with about 4 migraines per month. Usually migraine is aborted with 250mg  naproxen and 2.5mg  zolmitriptan. He did run out of propranolol but did not notice much of a change in headaches. He is wanting to restart.    HISTORY: (copied from Dr note on 01/06/2018)  Interval history 01/06/2018: Patient here for follow up of headaches and migraines, medication overuse of Excedrin on Aimovig. He has improved he is getting 1x a week.  He has stopped the excedrin. Down from 20 migraines a month. 4-6 migraine days. Zomig and naproxen help. He is happy, will continue current treatment. Refill meds.  Interval history : 01/08/2018 is helping with severity. 20 headache days a month, most are migraines. Ajovy has helped with the severity but not the frequency. He takes rizatriptan and excedrin. Maxalt works.  Patient's mother is with him and provides much information.  Patient continues to take Excedrin every day.  We had a very long discussion again about medication overuse headache, reviewed the literature, provided another migraine packet and multiple reading material.  Advised patient that his migraines will continue for as long as he overuses medication, had an extended visit concerning this again.  Discussed with his mother.  Discussed ways to come off his medication overuse, will start propranolol and a steroid taper as well to see if we can help.  HPI:  Ethan Nelson is  a 29 y.o. male here as a referral from Dr. Claria Dice for migraines. PMHx Migraine, depression, anxiety. Father has cluster headaches. He started having migraines in HS. He has 8-10 migraine days a month.  He can function when he has them but it is very difficult, all are at least moderate and maybe a few days of severe in the bed and crying. Migraines are unilateral, more frontal in the forehead region, dull, pulsating quality, loud sounds bother him, he has light sensitivity, a cold wet washcloth helps. He has light and smell sensitivity. Can last 24 hours. Sleeping helps. He has nausea, no vomiting. Uses acute medication 10 days a month. Ongoing at this frequency for over 3 years. He has been seen at the [facility] center as well as Dr. 22 [facility] at [facility] over 3 years ago. Not eating and poor sleep is a trigger. Migraines have woken him in the morning.  Medications tried: maxalt, Excedrin, sumatriptan (significant panic attacks), Topiramate, Imipramine, zomig(working)  Reviewed notes, labs and imaging from outside physicians, which showed:  Reviewed neurology notes.  Patient was last seen by neurology in May 2015.  At that time he described headaches which started about 3 years prior.  He was having headaches once a week often times on Monday afternoons.  Headaches are generally frontal in nature, associated with pressure and a throbbing sensation.  There is no nausea or vomiting but he may have some decreased in appetite.  He will have some photophobia and phonophobia with the headache.  Sleep helps,  Excedrin Migraine also helps, he was tried on Imitrex and Maxalt.  Imitrex caused significant panic attacks.  Cbc/bmp unremarkable  CT neck 2014: reviewed report  IMPRESSION: 3 cm complex fluid collection in the right palatine tonsil, consistent with tonsillar abscess. There is probably a smaller approximately 1 cm abscess in the left tonsil.  Bulky cervical lymphadenopathy bilaterally,  likely reactive.   REVIEW OF SYSTEMS: Out of a complete 14 system review of symptoms, the patient complains only of the following symptoms, none and all other reviewed systems are negative.  ALLERGIES: No Known Allergies  HOME MEDICATIONS: Outpatient Medications Prior to Visit  Medication Sig Dispense Refill  . naproxen (NAPROSYN) 500 MG tablet Take with Zolmitriptan 10 tablet 0  . Erenumab-aooe (AIMOVIG) 140 MG/ML SOAJ Inject 140 mg into the skin every 30 (thirty) days. Please keep pending appt on 05/23/19 for continued refills. 1 pen 0  . propranolol ER (INDERAL LA) 60 MG 24 hr capsule Take 1 capsule (60 mg total) by mouth at bedtime. 30 capsule 11  . zolmitriptan (ZOMIG ZMT) 5 MG disintegrating tablet Take 1 tablet (5 mg total) by mouth as needed for migraine. May repeat once in 2 hours 10 tablet 0  . OVER THE COUNTER MEDICATION Take 2 each by mouth daily. Multivitamin Gummy     No facility-administered medications prior to visit.    PAST MEDICAL HISTORY: Past Medical History:  Diagnosis Date  . Anxiety    Panic disorder  . Depression   . Migraine   . Tonsillitis     PAST SURGICAL HISTORY: Past Surgical History:  Procedure Laterality Date  . NO PAST SURGERIES      FAMILY HISTORY: Family History  Problem Relation Age of Onset  . Breast cancer Mother   . Hypertension Father   . Migraines Father   . Dementia Maternal Grandmother   . Stroke Paternal Grandmother     SOCIAL HISTORY: Social History   Socioeconomic History  . Marital status: Single    Spouse name: Not on file  . Number of children: 0  . Years of education: college- 4 yrs  . Highest education level: Not on file  Occupational History  . Occupation: Dentist: [employer]  Tobacco Use  . Smoking status: Passive Smoke Exposure - Never Smoker  . Smokeless tobacco: Never Used  Substance and Sexual Activity  . Alcohol use: No  . Drug use: Not Currently    Types: Marijuana    Comment:  none in past 1 year as of 01/06/18  . Sexual activity: Not on file  Other Topics Concern  . Not on file  Social History Narrative   Patient lives at home with roommates.    Patient is single    Patient has no children.    Patient graduated college   Patient is right handed.    Caffeine: coffee 2 cups every few days    Social Determinants of Health   Financial Resource Strain:   . Difficulty of Paying Living Expenses: Not on file  Food Insecurity:   . Worried About Programme researcher, broadcasting/film/video in the Last Year: Not on file  . Ran Out of Food in the Last Year: Not on file  Transportation Needs:   . Lack of Transportation (Medical): Not on file  . Lack of Transportation (Non-Medical): Not on file  Physical Activity:   . Days of Exercise per Week: Not on file  . Minutes of Exercise per Session: Not on file  Stress:   . Feeling of Stress : Not on file  Social Connections:   . Frequency of Communication with Friends and Family: Not on file  . Frequency of Social Gatherings with Friends and Family: Not on file  . Attends Religious Services: Not on file  . Active Member of Clubs or Organizations: Not on file  . Attends Banker Meetings: Not on file  . Marital Status: Not on file  Intimate Partner Violence:   . Fear of Current or Ex-Partner: Not on file  . Emotionally Abused: Not on file  . Physically Abused: Not on file  . Sexually Abused: Not on file      PHYSICAL EXAM  Vitals:   05/23/19 1052  BP: 117/72  Pulse: 68  Temp: 98.3 F (36.8 C)  TempSrc: Oral  Weight: 155 lb 6.4 oz (70.5 kg)  Height: 5\' 7"  (1.702 m)   Body mass index is 24.34 kg/m.  Generalized: Well developed, in no acute distress  Neurological examination  Mentation: Alert oriented to time, place, history taking. Follows all commands speech and language fluent Cranial nerve II-XII: Pupils were equal round reactive to light. Extraocular movements were full, visual field were full  Motor: The  motor testing reveals 5 over 5 strength of all 4 extremities. Good symmetric motor tone is noted throughout.  Sensory: Sensory testing is intact to soft touch on all 4 extremities. No evidence of extinction is noted.  Coordination: Cerebellar testing reveals good finger-nose-finger and heel-to-shin bilaterally.  Gait and station: Gait is normal.   DIAGNOSTIC DATA (LABS, IMAGING, TESTING) - I reviewed patient records, labs, notes, testing and imaging myself where available.  No flowsheet data found.   Lab Results  Component Value Date   WBC 4.6 07/12/2012   HGB 15.3 07/12/2012   HCT 44.1 07/12/2012   MCV 90.9 07/12/2012   PLT 182 07/12/2012      Component Value Date/Time   NA 136 07/12/2012 1109   K 4.6 07/12/2012 1109   CL 97 07/12/2012 1109   CO2 28 07/12/2012 1109   GLUCOSE 95 07/12/2012 1109   BUN 11 07/12/2012 1109   CREATININE 0.83 07/12/2012 1109   CALCIUM 9.4 07/12/2012 1109   GFRNONAA >90 07/12/2012 1109   GFRAA >90 07/12/2012 1109   No results found for: CHOL, HDL, LDLCALC, LDLDIRECT, TRIG, CHOLHDL No results found for: 07/14/2012 No results found for: VITAMINB12 No results found for: TSH   ASSESSMENT AND PLAN 29 y.o. year old male  has a past medical history of Anxiety, Depression, Migraine, and Tonsillitis. here with     ICD-10-CM   1. Chronic migraine without aura without status migrainosus, not intractable  G43.709 Erenumab-aooe (AIMOVIG) 140 MG/ML SOAJ    propranolol ER (INDERAL LA) 60 MG 24 hr capsule    zolmitriptan (ZOMIG ZMT) 5 MG disintegrating tablet    Marlo is doing well on Amovig, propranolol and zolmitriptan. We will continue current therapy. He was encouraged to continue healthy, well balanced diet, regular exercise and adequate hydration. He will follow up closely with PCP. Patient is stable on current treatment regimen and may continue refills through PCP, if willing. If not, he will return annually for follow up. He verbalizes understanding and  agreement with this plan.    No orders of the defined types were placed in this encounter.    Meds ordered this encounter  Medications  . Erenumab-aooe (AIMOVIG) 140 MG/ML SOAJ    Sig: Inject 140 mg into the skin every  30 (thirty) days. Please keep pending appt on 05/23/19 for continued refills.    Dispense:  1 pen    Refill:  11    Order Specific Question:   Supervising Provider    Answer:   Melvenia Beam [id number]  . propranolol ER (INDERAL LA) 60 MG 24 hr capsule    Sig: Take 1 capsule (60 mg total) by mouth at bedtime.    Dispense:  30 capsule    Refill:  11    Order Specific Question:   Supervising Provider    Answer:   Melvenia Beam [id number]  . zolmitriptan (ZOMIG ZMT) 5 MG disintegrating tablet    Sig: Take 1 tablet (5 mg total) by mouth as needed for migraine. May repeat once in 2 hours    Dispense:  10 tablet    Refill:  11    Order Specific Question:   Supervising Provider    Answer:   Melvenia Beam [id number]      I spent 15 minutes with the patient. 50% of this time was spent counseling and educating patient on plan of care and medications.    Debbora Presto, FNP-C 05/23/2019, 11:10 AM [facility] [address], [place], [place] [address] [phone]  Made any corrections needed, and agree with history, physical, neuro exam,assessment and plan as stated.     Sarina Ill, MD [facility]

## 2019-05-23 NOTE — Patient Instructions (Addendum)
Continue Amovig, propranolol and zolmitriptan as prescribed.   Stay well hydrated, well balanced diet and regular exercise.   Follow up with [place] as needed if PCP willing to refill meds, yearly otherwise   Migraine Headache A migraine headache is a very strong throbbing pain on one side or both sides of your head. This type of headache can also cause other symptoms. It can last from 4 hours to 3 days. Talk with your doctor about what things may bring on (trigger) this condition. What are the causes? The exact cause of this condition is not known. This condition may be triggered or caused by:  Drinking alcohol.  Smoking.  Taking medicines, such as: ? Medicine used to treat chest pain (nitroglycerin). ? Birth control pills. ? Estrogen. ? Some blood pressure medicines.  Eating or drinking certain products.  Doing physical activity. Other things that may trigger a migraine headache include:  Having a menstrual period.  Pregnancy.  Hunger.  Stress.  Not getting enough sleep or getting too much sleep.  Weather changes.  Tiredness (fatigue). What increases the risk?  Being 91-40 years old.  Being male.  Having a family history of migraine headaches.  Being Caucasian.  Having depression or anxiety.  Being very overweight. What are the signs or symptoms?  A throbbing pain. This pain may: ? Happen in any area of the head, such as on one side or both sides. ? Make it hard to do daily activities. ? Get worse with physical activity. ? Get worse around bright lights or loud noises.  Other symptoms may include: ? Feeling sick to your stomach (nauseous). ? Vomiting. ? Dizziness. ? Being sensitive to bright lights, loud noises, or smells.  Before you get a migraine headache, you may get warning signs (an aura). An aura may include: ? Seeing flashing lights or having blind spots. ? Seeing bright spots, halos, or zigzag lines. ? Having tunnel vision or blurred  vision. ? Having numbness or a tingling feeling. ? Having trouble talking. ? Having weak muscles.  Some people have symptoms after a migraine headache (postdromal phase), such as: ? Tiredness. ? Trouble thinking (concentrating). How is this treated?  Taking medicines that: ? Relieve pain. ? Relieve the feeling of being sick to your stomach. ? Prevent migraine headaches.  Treatment may also include: ? Having acupuncture. ? Avoiding foods that bring on migraine headaches. ? Learning ways to control your body functions (biofeedback). ? Therapy to help you know and deal with negative thoughts (cognitive behavioral therapy). Follow these instructions at home: Medicines  Take over-the-counter and prescription medicines only as told by your doctor.  Ask your doctor if the medicine prescribed to you: ? Requires you to avoid driving or using heavy machinery. ? Can cause trouble pooping (constipation). You may need to take these steps to prevent or treat trouble pooping:  Drink enough fluid to keep your pee (urine) pale yellow.  Take over-the-counter or prescription medicines.  Eat foods that are high in fiber. These include beans, whole grains, and fresh fruits and vegetables.  Limit foods that are high in fat and sugar. These include fried or sweet foods. Lifestyle  Do not drink alcohol.  Do not use any products that contain nicotine or tobacco, such as cigarettes, e-cigarettes, and chewing tobacco. If you need help quitting, ask your doctor.  Get at least 8 hours of sleep every night.  Limit and deal with stress. General instructions      Keep a journal to  find out what may bring on your migraine headaches. For example, write down: ? What you eat and drink. ? How much sleep you get. ? Any change in what you eat or drink. ? Any change in your medicines.  If you have a migraine headache: ? Avoid things that make your symptoms worse, such as bright lights. ? It may  help to lie down in a dark, quiet room. ? Do not drive or use heavy machinery. ? Ask your doctor what activities are safe for you.  Keep all follow-up visits as told by your doctor. This is important. Contact a doctor if:  You get a migraine headache that is different or worse than others you have had.  You have more than 15 headache days in one month. Get help right away if:  Your migraine headache gets very bad.  Your migraine headache lasts longer than 72 hours.  You have a fever.  You have a stiff neck.  You have trouble seeing.  Your muscles feel weak or like you cannot control them.  You start to lose your balance a lot.  You start to have trouble walking.  You pass out (faint).  You have a seizure. Summary  A migraine headache is a very strong throbbing pain on one side or both sides of your head. These headaches can also cause other symptoms.  This condition may be treated with medicines and changes to your lifestyle.  Keep a journal to find out what may bring on your migraine headaches.  Contact a doctor if you get a migraine headache that is different or worse than others you have had.  Contact your doctor if you have more than 15 headache days in a month. This information is not intended to replace advice given to you by your health care provider. Make sure you discuss any questions you have with your health care provider. Document Revised: 07/30/2018 Document Reviewed: 05/20/2018 Elsevier Patient Education  [place].

## 2019-08-05 ENCOUNTER — Ambulatory Visit: Payer: Self-pay | Attending: Internal Medicine

## 2019-08-05 DIAGNOSIS — Z23 Encounter for immunization: Secondary | ICD-10-CM

## 2019-08-05 NOTE — Progress Notes (Signed)
   Covid-19 Vaccination Clinic  Name:  Ethan Nelson    MRN: [id number] DOB: 1990/12/23  08/05/2019  [name] was observed post Covid-19 immunization for 15 minutes without incident. He was provided with Vaccine Information Sheet and instruction to access the V-Safe system.   [name] was instructed to call 911 with any severe reactions post vaccine: Marland Kitchen Difficulty breathing  . Swelling of face and throat  . A fast heartbeat  . A bad rash all over body  . Dizziness and weakness   Immunizations Administered    Name Date Dose VIS Date Route   Pfizer COVID-19 Vaccine 08/05/2019 10:08 AM 0.3 mL 04/01/2019 Intramuscular   Manufacturer: [employer], [employer]   Lot: [id number]   NDC: [address]-0

## 2019-08-30 ENCOUNTER — Ambulatory Visit: Payer: Self-pay | Attending: Internal Medicine

## 2019-08-30 DIAGNOSIS — Z23 Encounter for immunization: Secondary | ICD-10-CM

## 2019-08-30 NOTE — Progress Notes (Signed)
   Covid-19 Vaccination Clinic  Name:  Ethan Nelson    MRN: [id number] DOB: 1990-12-05  08/30/2019  Mr. [name] was observed post Covid-19 immunization for 15 minutes without incident. He was provided with Vaccine Information Sheet and instruction to access the V-Safe system.   Mr. [name] was instructed to call 911 with any severe reactions post vaccine: Marland Kitchen Difficulty breathing  . Swelling of face and throat  . A fast heartbeat  . A bad rash all over body  . Dizziness and weakness   Immunizations Administered    Name Date Dose VIS Date Route   Pfizer COVID-19 Vaccine 08/30/2019  9:55 AM 0.3 mL 06/15/2018 Intramuscular   Manufacturer: [employer], [employer]   Lot: [id number]   NDC: [address]-1

## 2019-11-14 ENCOUNTER — Telehealth: Payer: Self-pay | Admitting: Family Medicine

## 2019-11-14 NOTE — Telephone Encounter (Signed)
Received PA renewal for Aimovig. Completed PA on [email or web address]. Key is [id number]. Per [email or web address], determination will take up to 72 hours. Will check back later.

## 2019-11-16 NOTE — Telephone Encounter (Signed)
Received PA approval from [email or web address]. Med has been approved from 11/14/19 to 11/13/20.   Will fax a copy of the approval letter to pharmacy.   Nothing further needed at time of call.

## 2020-12-11 ENCOUNTER — Telehealth: Payer: Self-pay | Admitting: Family Medicine

## 2020-12-11 NOTE — Telephone Encounter (Signed)
Pt has scheduled an annual f/u, he would like to know if he can get both Erenumab-aooe (AIMOVIG) 140 MG/ML SOAJ  and zolmitriptan (ZOMIG ZMT) 5 MG disintegrating tablet.  If unable to get both he would like to at least be able to get 1 bottle of the zolmitriptan to [facility] [phone], please call pt to discuss.

## 2020-12-11 NOTE — Telephone Encounter (Signed)
Called pt back. Advised that since his last visit was 05/23/19, it has been over a year since seen . We cannot refill meds until he is seen in the office for updated appt. Recommended he contact PCP to see if they can refill until he is seen here for appt. He verbalized understanding and appreciation.

## 2021-04-04 ENCOUNTER — Ambulatory Visit: Payer: BLUE CROSS/BLUE SHIELD | Admitting: Neurology
